# Patient Record
Sex: Male | Born: 1970 | Race: White | Hispanic: No | Marital: Married | Smoking: Never smoker
Health system: Southern US, Community
[De-identification: ages and names within clinical notes are randomized; demographics above are authoritative.]

## PROBLEM LIST (undated history)

## (undated) DIAGNOSIS — I10 Essential (primary) hypertension: Secondary | ICD-10-CM

## (undated) HISTORY — PX: BACK SURGERY: SHX140

---

## 1998-02-23 ENCOUNTER — Inpatient Hospital Stay (HOSPITAL_COMMUNITY): Admission: RE | Admit: 1998-02-23 | Discharge: 1998-02-25 | Payer: Self-pay | Admitting: Neurosurgery

## 2005-10-24 ENCOUNTER — Encounter: Payer: Self-pay | Admitting: Neurosurgery

## 2006-11-02 ENCOUNTER — Emergency Department: Payer: Self-pay | Admitting: Emergency Medicine

## 2014-05-11 DIAGNOSIS — I10 Essential (primary) hypertension: Secondary | ICD-10-CM | POA: Insufficient documentation

## 2019-07-24 ENCOUNTER — Other Ambulatory Visit: Payer: Self-pay | Admitting: Nurse Practitioner

## 2019-07-24 DIAGNOSIS — R109 Unspecified abdominal pain: Secondary | ICD-10-CM

## 2019-07-24 DIAGNOSIS — R3129 Other microscopic hematuria: Secondary | ICD-10-CM

## 2019-07-28 ENCOUNTER — Encounter (INDEPENDENT_AMBULATORY_CARE_PROVIDER_SITE_OTHER): Payer: Self-pay

## 2019-07-28 ENCOUNTER — Other Ambulatory Visit: Payer: Self-pay

## 2019-07-28 ENCOUNTER — Ambulatory Visit
Admission: RE | Admit: 2019-07-28 | Discharge: 2019-07-28 | Disposition: A | Discharge status: Home / Self Care | Payer: Managed Care, Other (non HMO) | Source: Ambulatory Visit | Attending: Nurse Practitioner | Admitting: Nurse Practitioner

## 2019-07-28 DIAGNOSIS — R3129 Other microscopic hematuria: Secondary | ICD-10-CM | POA: Insufficient documentation

## 2019-07-28 DIAGNOSIS — R109 Unspecified abdominal pain: Secondary | ICD-10-CM | POA: Diagnosis not present

## 2019-07-28 DIAGNOSIS — K760 Fatty (change of) liver, not elsewhere classified: Secondary | ICD-10-CM | POA: Insufficient documentation

## 2019-08-24 ENCOUNTER — Other Ambulatory Visit: Payer: Self-pay | Admitting: Gastroenterology

## 2019-08-24 DIAGNOSIS — R7401 Elevation of levels of liver transaminase levels: Secondary | ICD-10-CM

## 2019-08-31 ENCOUNTER — Ambulatory Visit
Admission: RE | Admit: 2019-08-31 | Discharge: 2019-08-31 | Disposition: A | Discharge status: Home / Self Care | Payer: Managed Care, Other (non HMO) | Source: Ambulatory Visit | Attending: Gastroenterology | Admitting: Gastroenterology

## 2019-08-31 ENCOUNTER — Other Ambulatory Visit: Payer: Self-pay

## 2019-08-31 DIAGNOSIS — R7401 Elevation of levels of liver transaminase levels: Secondary | ICD-10-CM | POA: Diagnosis present

## 2021-10-20 ENCOUNTER — Emergency Department: Payer: Managed Care, Other (non HMO)

## 2021-10-20 ENCOUNTER — Emergency Department
Admission: EM | Admit: 2021-10-20 | Discharge: 2021-10-20 | Disposition: A | Discharge status: Home / Self Care | Payer: Managed Care, Other (non HMO) | Attending: Emergency Medicine | Admitting: Emergency Medicine

## 2021-10-20 ENCOUNTER — Other Ambulatory Visit: Payer: Self-pay

## 2021-10-20 DIAGNOSIS — I10 Essential (primary) hypertension: Secondary | ICD-10-CM | POA: Diagnosis not present

## 2021-10-20 DIAGNOSIS — W06XXXA Fall from bed, initial encounter: Secondary | ICD-10-CM | POA: Insufficient documentation

## 2021-10-20 DIAGNOSIS — H81399 Other peripheral vertigo, unspecified ear: Secondary | ICD-10-CM | POA: Diagnosis not present

## 2021-10-20 DIAGNOSIS — R42 Dizziness and giddiness: Secondary | ICD-10-CM | POA: Diagnosis present

## 2021-10-20 HISTORY — DX: Essential (primary) hypertension: I10

## 2021-10-20 LAB — CBC
HCT: 49.4 % (ref 39.0–52.0)
Hemoglobin: 16.4 g/dL (ref 13.0–17.0)
MCH: 27.9 pg (ref 26.0–34.0)
MCHC: 33.2 g/dL (ref 30.0–36.0)
MCV: 84 fL (ref 80.0–100.0)
Platelets: 244 10*3/uL (ref 150–400)
RBC: 5.88 MIL/uL — ABNORMAL HIGH (ref 4.22–5.81)
RDW: 12.8 % (ref 11.5–15.5)
WBC: 8.7 10*3/uL (ref 4.0–10.5)
nRBC: 0 % (ref 0.0–0.2)

## 2021-10-20 LAB — BASIC METABOLIC PANEL
Anion gap: 12 (ref 5–15)
BUN: 15 mg/dL (ref 6–20)
CO2: 22 mmol/L (ref 22–32)
Calcium: 8.9 mg/dL (ref 8.9–10.3)
Chloride: 103 mmol/L (ref 98–111)
Creatinine, Ser: 1.35 mg/dL — ABNORMAL HIGH (ref 0.61–1.24)
GFR, Estimated: >60 mL/min (ref >60)
Glucose, Bld: 231 mg/dL — ABNORMAL HIGH (ref 70–99)
Potassium: 3.2 mmol/L — ABNORMAL LOW (ref 3.5–5.1)
Sodium: 137 mmol/L (ref 135–145)

## 2021-10-20 MED ORDER — DIAZEPAM 5 MG PO TABS: 5.0000 mg | ORAL_TABLET | Freq: Two times a day (BID) | ORAL | 0 refills | Status: AC | PRN

## 2021-10-20 MED ORDER — ONDANSETRON 4 MG PO TBDP: 4.0000 mg | ORAL_TABLET | Freq: Three times a day (TID) | ORAL | 0 refills | Status: DC | PRN

## 2021-10-20 MED ORDER — MECLIZINE HCL 25 MG PO TABS
25.0000 mg | ORAL_TABLET | Freq: Once | ORAL | Status: AC
  Administered 2021-10-20: 25 mg via ORAL
  Filled 2021-10-20: qty 1

## 2021-10-20 MED ORDER — DIAZEPAM 5 MG PO TABS
5.0000 mg | ORAL_TABLET | Freq: Once | ORAL | Status: AC
  Administered 2021-10-20: 5 mg via ORAL
  Filled 2021-10-20: qty 1

## 2021-10-20 MED ORDER — POTASSIUM CHLORIDE CRYS ER 20 MEQ PO TBCR
40.0000 meq | EXTENDED_RELEASE_TABLET | Freq: Once | ORAL | Status: AC
  Administered 2021-10-20: 40 meq via ORAL
  Filled 2021-10-20: qty 2

## 2021-10-20 NOTE — ED Provider Notes (Signed)
? ?[facility] ?Provider Note ? ? ? Event Date/Time  ? First MD Initiated Contact with Patient 10/20/21 0901   ?  (approximate) ? ? ?History  ? ?Chief Complaint ?Dizziness ? ? ?HPI ? ?Francisco Forbes is a 51 y.o. male with past medical history of hypertension and vertigo who presents to the ED complaining of dizziness.  Patient reports that he woke up this morning around 8:00 with sensation of severe dizziness.  He states it felt like the room was spinning around him and that he was going to fall off the bed.  This has been associated with nausea but he has not vomited.  He denies any associated vision changes, speech changes, numbness, or weakness in his extremities.  He has dealt with vertigo in the past, but states he has never had symptoms this severe.  He went to bed feeling fine last night at around 10:30 PM.  He denies any recent fever, cough, chest pain, shortness of breath, abdominal pain, or vomiting. ?  ? ? ?Physical Exam  ? ?Triage Vital Signs: ?ED Triage Vitals  ?Enc Vitals Group  ?   BP 10/20/21 0814 (!) 153/84  ?   Pulse Rate 10/20/21 0814 82  ?   Resp 10/20/21 0814 19  ?   Temp 10/20/21 0814 97.8 ?F (36.6 ?C)  ?   Temp Source 10/20/21 0814 Oral  ?   SpO2 10/20/21 0814 98 %  ?   Weight 10/20/21 0816 211 lb (95.7 kg)  ?   Height 10/20/21 0816 5\' 8"  (1.727 m)  ?   Head Circumference --   ?   Peak Flow --   ?   Pain Score 10/20/21 0816 0  ?   Pain Loc --   ?   Pain Edu? --   ?   Excl. in GC? --   ? ? ?Most recent vital signs: ?Vitals:  ? 10/20/21 0814 10/20/21 1020  ?BP: (!) 153/84 (!) 150/81  ?Pulse: 82 73  ?Resp: 19 19  ?Temp: 97.8 ?F (36.6 ?C) 98.7 ?F (37.1 ?C)  ?SpO2: 98% 97%  ? ? ?Constitutional: Alert and oriented. ?Eyes: Conjunctivae are normal.  Pupils equal, round, and reactive to light bilaterally. ?Head: Atraumatic. ?Nose: No congestion/rhinnorhea. ?Mouth/Throat: Mucous membranes are moist.  ?Cardiovascular: Normal rate, regular rhythm. Grossly normal heart sounds.  2+  radial pulses bilaterally. ?Respiratory: Normal respiratory effort.  No retractions. Lungs CTAB. ?Gastrointestinal: Soft and nontender. No distention. ?Musculoskeletal: No lower extremity tenderness nor edema.  ?Neurologic:  Normal speech and language. No gross focal neurologic deficits are appreciated. ? ? ? ?ED Results / Procedures / Treatments  ? ?Labs ?(all labs ordered are listed, but only abnormal results are displayed) ?Labs Reviewed  ?BASIC METABOLIC PANEL - Abnormal; Notable for the following components:  ?    Result Value  ? Potassium 3.2 (*)   ? Glucose, Bld 231 (*)   ? Creatinine, Ser 1.35 (*)   ? All other components within normal limits  ?CBC - Abnormal; Notable for the following components:  ? RBC 5.88 (*)   ? All other components within normal limits  ?URINALYSIS, ROUTINE W REFLEX MICROSCOPIC  ?CBG MONITORING, ED  ? ? ? ?EKG ? ?ED ECG REPORT ?10/22/21, the attending physician, personally viewed and interpreted this ECG. ? ? Date: 10/20/2021 ? EKG Time: 8:21 ? Rate: 76 ? Rhythm: normal sinus rhythm ? Axis: Normal ? Intervals:nonspecific intraventricular conduction delay ? ST&T Change: None ? ?RADIOLOGY ?MRI  brain reviewed by me with no obvious area of infarction. ? ?PROCEDURES: ? ?Critical Care performed: No ? ?Procedures ? ? ?MEDICATIONS ORDERED IN ED: ?Medications  ?potassium chloride SA (KLOR-CON M) CR tablet 40 mEq (40 mEq Oral Given 10/20/21 0945)  ?meclizine (ANTIVERT) tablet 25 mg (25 mg Oral Given 10/20/21 0945)  ?diazepam (VALIUM) tablet 5 mg (5 mg Oral Given 10/20/21 1049)  ? ? ? ?IMPRESSION / MDM / ASSESSMENT AND PLAN / ED COURSE  ?I reviewed the triage vital signs and the nursing notes. ?             ?               ? ?51 y.o. male with past medical history of hypertension and vertigo who presents to the ED complaining of sudden onset severe dizziness and nausea, with feeling that the room is spinning around him, starting this morning. ? ?Differential diagnosis includes, but is not  limited to, peripheral vertigo, central vertigo, stroke, dehydration, electrolyte abnormality, arrhythmia. ? ?Patient nontoxic-appearing and in no acute distress, vital signs are unremarkable and EKG shows no evidence of arrhythmia or ischemia.  He has a nonfocal neurologic exam and with severe intermittent symptoms, low suspicion for stroke or other central etiology of vertigo.  Symptoms seem consistent with a peripheral vertigo and we will treat with IV fluids, Zofran, and meclizine.  BMP is remarkable for mild hypokalemia, CBC without anemia or leukocytosis.  Plan to reassess following symptomatic management. ? ?Patient without any improvement in symptoms following Zofran and meclizine.  Given ongoing symptoms, he was further assessed with MRI of brain.  This is negative for acute process and with no evidence of acute stroke, symptoms consistent with a peripheral vertigo.  He had improvement in symptoms with dose of Valium and is appropriate for discharge home with PCP and ENT follow-up.  He was counseled to return to the ED for new worsening symptoms, patient agrees with plan. ? ?  ? ? ?FINAL CLINICAL IMPRESSION(S) / ED DIAGNOSES  ? ?Final diagnoses:  ?Peripheral vertigo, unspecified laterality  ? ? ? ?Rx / DC Orders  ? ?ED Discharge Orders   ? ?      Ordered  ?  diazepam (VALIUM) 5 MG tablet  Every 12 hours PRN       ? 10/20/21 1136  ?  ondansetron (ZOFRAN-ODT) 4 MG disintegrating tablet  Every 8 hours PRN       ? 10/20/21 1136  ? ?  ?  ? ?  ? ? ? ?Note:  This document was prepared using Dragon voice recognition software and may include unintentional dictation errors. ?  ?Chesley Noon, MD ?10/20/21 1139 ? ?

## 2021-10-20 NOTE — ED Triage Notes (Signed)
Patient to ER via POV with wife. Reports waking up this morning feelign dizzy, describes feeling like the world is spinning. States when he woke up he felt as though he was going to fall out of bed. History of vertigo. Reports going to bed last night at 2230 feeling normal. Denies headache. Also reports nausea and vomiting. Denies any other symptoms.  ?

## 2021-11-02 ENCOUNTER — Ambulatory Visit
Admission: EM | Admit: 2021-11-02 | Discharge: 2021-11-02 | Disposition: A | Discharge status: Home / Self Care | Payer: Managed Care, Other (non HMO) | Attending: Emergency Medicine | Admitting: Emergency Medicine

## 2021-11-02 DIAGNOSIS — I10 Essential (primary) hypertension: Secondary | ICD-10-CM | POA: Insufficient documentation

## 2021-11-02 DIAGNOSIS — R7302 Impaired glucose tolerance (oral): Secondary | ICD-10-CM | POA: Insufficient documentation

## 2021-11-02 DIAGNOSIS — R42 Dizziness and giddiness: Secondary | ICD-10-CM | POA: Insufficient documentation

## 2021-11-02 DIAGNOSIS — R202 Paresthesia of skin: Secondary | ICD-10-CM | POA: Insufficient documentation

## 2021-11-02 DIAGNOSIS — E669 Obesity, unspecified: Secondary | ICD-10-CM | POA: Insufficient documentation

## 2021-11-02 LAB — CBC WITH DIFFERENTIAL/PLATELET
Abs Immature Granulocytes: 0.03 10*3/uL (ref 0.00–0.07)
Basophils Absolute: 0.1 10*3/uL (ref 0.0–0.1)
Basophils Relative: 1 %
Eosinophils Absolute: 0.2 10*3/uL (ref 0.0–0.5)
Eosinophils Relative: 3 %
HCT: 49.3 % (ref 39.0–52.0)
Hemoglobin: 17 g/dL (ref 13.0–17.0)
Immature Granulocytes: 0 %
Lymphocytes Relative: 23 %
Lymphs Abs: 1.8 10*3/uL (ref 0.7–4.0)
MCH: 29 pg (ref 26.0–34.0)
MCHC: 34.5 g/dL (ref 30.0–36.0)
MCV: 84 fL (ref 80.0–100.0)
Monocytes Absolute: 0.6 10*3/uL (ref 0.1–1.0)
Monocytes Relative: 8 %
Neutro Abs: 5.1 10*3/uL (ref 1.7–7.7)
Neutrophils Relative %: 65 %
Platelets: 232 10*3/uL (ref 150–400)
RBC: 5.87 MIL/uL — ABNORMAL HIGH (ref 4.22–5.81)
RDW: 13 % (ref 11.5–15.5)
WBC: 7.8 10*3/uL (ref 4.0–10.5)
nRBC: 0 % (ref 0.0–0.2)

## 2021-11-02 LAB — HEMOGLOBIN A1C
Hgb A1c MFr Bld: 6.4 % — ABNORMAL HIGH (ref 4.8–5.6)
Mean Plasma Glucose: 136.98 mg/dL

## 2021-11-02 LAB — BASIC METABOLIC PANEL
Anion gap: 7 (ref 5–15)
BUN: 12 mg/dL (ref 6–20)
CO2: 28 mmol/L (ref 22–32)
Calcium: 9.2 mg/dL (ref 8.9–10.3)
Chloride: 101 mmol/L (ref 98–111)
Creatinine, Ser: 1.16 mg/dL (ref 0.61–1.24)
GFR, Estimated: >60 mL/min (ref >60)
Glucose, Bld: 221 mg/dL — ABNORMAL HIGH (ref 70–99)
Potassium: 4.1 mmol/L (ref 3.5–5.1)
Sodium: 136 mmol/L (ref 135–145)

## 2021-11-02 LAB — GLUCOSE, CAPILLARY: Glucose-Capillary: 229 mg/dL — ABNORMAL HIGH (ref 70–99)

## 2021-11-02 NOTE — ED Provider Notes (Signed)
?HPI ? ?SUBJECTIVE: ? ?Francisco Forbes is a 51 y.o. male who presents with feeling "uneasy 3 and jittery this morning.  He reports very mild bilateral arm and leg tingling that is intermittent, lasting seconds.  No nausea, vomiting, diaphoresis, perioral numbness or tingling, headache, visual changes, arm or leg weakness, facial droop, slurred speech, chest pain, palpitations, shortness of breath chest pressure or heaviness, abdominal pain, syncope, seizures, lower extremity edema, anuria, hematuria.  No recent decongestions, illicit drug use.  He states that he has 112 ounce Dr. Reino Kent a day.  Denies other caffeine intake.  He is eating and drinking well.  No recent diarrhea.  He is taking atenolol and has tried eating.  The symptoms resolved on their own.  No aggravating factors. ? ?Patient was seen in the ED on 4/28 with dizziness/vertigo, thought to have peripheral vertigo.  Glucose was 231, creatinine was 1.35, he  had mild hypokalemia which was replaced.  He was given Zofran, Valium, meclizine with improvement in his symptoms.  He had an MRI which was negative for a stroke.  He was advised to follow-up with PCP and ENT.  Sent home with Valium and Zofran.  He has had no further episodes of vertigo since the ED visit, and has his final ENT follow-up in 3 days.  He has a past medical history of hypertension for which he takes atenolol.  He does not measure his blood pressure at home, he does not know what it has been running recently.  He also has a past medical history of prediabetes.  No history of coronary disease, MI, stroke, chronic kidney disease, hypercholesterolemia.  PCP: [facility]. ? ?Past Medical History:  ?Diagnosis Date  ? Hypertension   ? ? ?Past Surgical History:  ?Procedure Laterality Date  ? BACK SURGERY    ? ? ?History reviewed. No pertinent family history. ? ?Social History  ? ?Tobacco Use  ? Smoking status: Never  ? Smokeless tobacco: Never  ?Vaping Use  ? Vaping Use: Never used   ?Substance Use Topics  ? Alcohol use: Never  ? Drug use: Never  ? ? ?No current facility-administered medications for this encounter. ? ?Current Outpatient Medications:  ?  atenolol (TENORMIN) 25 MG tablet, Take 1 tablet by mouth daily., Disp: , Rfl:  ?  fluticasone (FLONASE) 50 MCG/ACT nasal spray, Place into the nose., Disp: , Rfl:  ?  diazepam (VALIUM) 5 MG tablet, Take 1 tablet (5 mg total) by mouth every 12 (twelve) hours as needed (Dizziness)., Disp: 12 tablet, Rfl: 0 ?  metroNIDAZOLE (METROGEL) 1 % gel, Apply topically daily., Disp: , Rfl:  ?  ondansetron (ZOFRAN-ODT) 4 MG disintegrating tablet, Take 1 tablet (4 mg total) by mouth every 8 (eight) hours as needed for nausea or vomiting., Disp: 12 tablet, Rfl: 0 ?  scopolamine (TRANSDERM-SCOP) 1 MG/3DAYS, 1 patch every 3 (three) days., Disp: , Rfl:  ? ?Allergies  ?Allergen Reactions  ? Penicillin V Potassium   ?  Other reaction(s): Dizziness  ? ? ? ?ROS ? ?As noted in HPI.  ? ?Physical Exam ? ?BP (!) 162/89 (BP Location: Left Arm)   Pulse 76   Temp 98.2 ?F (36.8 ?C) (Oral)   Resp 18   Ht 5\' 8"  (1.727 m)   Wt 95.7 kg   SpO2 98%   BMI 32.08 kg/m?  ? ?Constitutional: Well developed, well nourished, no acute distress ?Eyes: PERRL, EOMI, conjunctiva normal bilaterally ?HENT: Normocephalic, atraumatic,mucus membranes moist ?Respiratory: Clear to auscultation bilaterally,  no rales, no wheezing, no rhonchi ?Cardiovascular: Normal rate and rhythm, no murmurs, no gallops, no rubs.  RP 2+.  PT 2+. ?GI: Soft, nondistended, normal bowel sounds, nontender, no rebound, no guarding ?skin: No rash, skin intact ?Musculoskeletal: No edema, no tenderness, no deformities ?Neurologic: Alert & oriented x 3, CN III-XII  intact, strength upper and lower extremities 5/5 and equal bilaterally, sensation upper and lower extremities grossly intact.  Finger-nose, heel shin normal.  Gait steady.  Negative Romberg. ?Psychiatric: Speech and behavior appropriate ? ? ?ED  Course ? ? ?Medications - No data to display ? ?Orders Placed This Encounter  ?Procedures  ? Glucose, capillary  ?  Standing Status:   Standing  ?  Number of Occurrences:   1  ? Basic metabolic panel  ?  Standing Status:   Standing  ?  Number of Occurrences:   1  ? CBC with Differential  ?  Standing Status:   Standing  ?  Number of Occurrences:   1  ? Hemoglobin A1c  ?  Standing Status:   Standing  ?  Number of Occurrences:   1  ? POC CBG, ED  ?  Standing Status:   Standing  ?  Number of Occurrences:   1  ? ED EKG  ?  See RFV. Complaint  ?  Standing Status:   Standing  ?  Number of Occurrences:   1  ?  Order Specific Question:   Reason for Exam  ?  Answer:   Other (See Comments)  ?  Order Specific Question:   Release to patient  ?  Answer:   Immediate  ? ED EKG  ?  Dizziness  ?  Standing Status:   Standing  ?  Number of Occurrences:   1  ?  Order Specific Question:   Reason for Exam  ?  Answer:   Other (See Comments)  ? EKG 12-Lead  ?  Standing Status:   Standing  ?  Number of Occurrences:   1  ? EKG 12-Lead  ?  Standing Status:   Standing  ?  Number of Occurrences:   1  ? ?Results for orders placed or performed during the hospital encounter of 11/02/21 (from the past 24 hour(s))  ?Glucose, capillary     Status: Abnormal  ? Collection Time: 11/02/21  9:48 AM  ?Result Value Ref Range  ? Glucose-Capillary 229 (H) 70 - 99 mg/dL  ?Basic metabolic panel     Status: Abnormal  ? Collection Time: 11/02/21 10:20 AM  ?Result Value Ref Range  ? Sodium 136 135 - 145 mmol/L  ? Potassium 4.1 3.5 - 5.1 mmol/L  ? Chloride 101 98 - 111 mmol/L  ? CO2 28 22 - 32 mmol/L  ? Glucose, Bld 221 (H) 70 - 99 mg/dL  ? BUN 12 6 - 20 mg/dL  ? Creatinine, Ser 1.16 0.61 - 1.24 mg/dL  ? Calcium 9.2 8.9 - 10.3 mg/dL  ? GFR, Estimated >60 >60 mL/min  ? Anion gap 7 5 - 15  ?CBC with Differential     Status: Abnormal  ? Collection Time: 11/02/21 10:20 AM  ?Result Value Ref Range  ? WBC 7.8 4.0 - 10.5 K/uL  ? RBC 5.87 (H) 4.22 - 5.81 MIL/uL  ?  Hemoglobin 17.0 13.0 - 17.0 g/dL  ? HCT 49.3 39.0 - 52.0 %  ? MCV 84.0 80.0 - 100.0 fL  ? MCH 29.0 26.0 - 34.0 pg  ? MCHC 34.5 30.0 -  36.0 g/dL  ? RDW 13.0 11.5 - 15.5 %  ? Platelets 232 150 - 400 K/uL  ? nRBC 0.0 0.0 - 0.2 %  ? Neutrophils Relative % 65 %  ? Neutro Abs 5.1 1.7 - 7.7 K/uL  ? Lymphocytes Relative 23 %  ? Lymphs Abs 1.8 0.7 - 4.0 K/uL  ? Monocytes Relative 8 %  ? Monocytes Absolute 0.6 0.1 - 1.0 K/uL  ? Eosinophils Relative 3 %  ? Eosinophils Absolute 0.2 0.0 - 0.5 K/uL  ? Basophils Relative 1 %  ? Basophils Absolute 0.1 0.0 - 0.1 K/uL  ? Immature Granulocytes 0 %  ? Abs Immature Granulocytes 0.03 0.00 - 0.07 K/uL  ? ?No results found. ? ?ED Clinical Impression ? ?1. Hypertension, unspecified type   ?2. Dizziness   ?3. Paresthesias   ?  ? ?ED Assessment/Plan ? ?ER records reviewed.  As noted in HPI. ? ?EKG: Normal sinus rhythm, rate 79.  Normal axis, incomplete right bundle branch block.  No ST-T wave changes.  No changes compared to EKG from 10/20/2021.  ? ?Doubt stroke as it does not make anatomic sense, and his complete neurologic exam is benign.  I do not think that we need to image his head today.  He had a recent negative MRI 2 weeks ago.  His EKG today is unremarkable.  He had renal insufficiency and hypokalemia with his last set of labs in the ED, so will repeat a BMP, CBC and do a hemoglobin A1c as a courtesy to his primary care provider. ? ?BMP unremarkable except for glucose of 221.  His creatinine has normalized.  CBC normal. ? ?Unsure as to the etiology of patient's symptoms, but there does not appear to be an emergency at this time.  He denies dizziness or vertigo today.  He has had no further episodes of vertigo since his ER visit 2 weeks ago.  He has a known normal complete neurologic exam, reassuring EKG, lab work.  Hemoglobin A1c pending.  He will follow-up this result with his PCP.  Will let PCP initiate diabetic treatment if necessary. ? ?Blood pressure noted.  Patient has no signs  or symptoms of a hypertensive emergency. ?Discussed labs, EKG, MDM, treatment plan, and plan for follow-up with patient Discussed sn/sx that should prompt return to the ED. patient agrees with plan.  ? ?No orders of the defined types were

## 2021-11-02 NOTE — ED Triage Notes (Signed)
Patient is here for "jitters, dizziness at times, tingling over extremities". Concerned with Blood Sugar. Last 2 wks "a pretty good case of Vertigo". "Woke up this am with feeling uneasy, jitters and dizziness continues". No chest pain. No sob.No recent cold/illness. "More arms tingling than numbness".  ?

## 2021-11-02 NOTE — Discharge Instructions (Addendum)
Your lab work is normal except for an elevated glucose.  I am sending off an hemoglobin A1c to make a diagnosis of diabetes.  You could still be a prediabetic.  I will let your primary care provider start diabetic medication based on this result if necessary.  Your EKG and your physical exam are reassuring.  Keep an eye on your blood pressure.  It is elevated today.  However, I do not think that you are having a stroke.  ? ?Decrease your salt intake. diet and exercise will lower your blood pressure significantly. It is important to keep your blood pressure under good control, as having a elevated blood pressure for prolonged periods of time significantly increases your risk of stroke, heart attacks, kidney damage, eye damage, and other problems. Measure your blood pressure once a day, preferably at the same time every day. Keep a log of this and bring it to your next doctor's appointment.  Bring your blood pressure cuff as well.   Return immediately to the ER if you start having chest pain, headache, problems seeing, problems talking, problems walking, if you feel like you're about to pass out, if you do pass out, if you have a seizure, or for any other concerns. ? ?Go to www.goodrx.com  or www.costplusdrugs.com to look up your medications. This will give you a list of where you can find your prescriptions at the most affordable prices. Or ask the pharmacist what the cash price is, or if they have any other discount programs available to help make your medication more affordable. This can be less expensive than what you would pay with insurance.   ?

## 2022-03-02 ENCOUNTER — Encounter
Admission: RE | Disposition: A | Discharge status: Home / Self Care | Payer: Self-pay | Source: Ambulatory Visit | Attending: Unknown Physician Specialty

## 2022-03-02 SURGERY — SEPTOPLASTY, NOSE
Anesthesia: General

## 2022-03-26 DIAGNOSIS — R7303 Prediabetes: Secondary | ICD-10-CM | POA: Insufficient documentation

## 2022-03-26 HISTORY — DX: Prediabetes: R73.03

## 2022-05-02 NOTE — Discharge Instructions (Signed)
[facility] ENDOSCOPIC SINUS SURGERY [facility]  What is Functional Endoscopic Sinus Surgery?  The Surgery involves making the natural openings of the sinuses larger by removing the bony partitions that separate the sinuses from the nasal cavity.  The natural sinus lining is preserved as much as possible to allow the sinuses to resume normal function after the surgery.  In some patients nasal polyps (excessively swollen lining of the sinuses) may be removed to relieve obstruction of the sinus openings.  The surgery is performed through the nose using lighted scopes, which eliminates the need for incisions on the face.  A septoplasty is a different procedure which is sometimes performed with sinus surgery.  It involves straightening the boy partition that separates the two sides of your nose.  A crooked or deviated septum may need repair if is obstructing the sinuses or nasal airflow.  Turbinate reduction is also often performed during sinus surgery.  The turbinates are bony proturberances from the side walls of the nose which swell and can obstruct the nose in patients with sinus and allergy problems.  Their size can be surgically reduced to help relieve nasal obstruction.  What Can Sinus Surgery Do For Me?  Sinus surgery can reduce the frequency of sinus infections requiring antibiotic treatment.  This can provide improvement in nasal congestion, post-nasal drainage, facial pressure and nasal obstruction.  Surgery will NOT prevent you from ever having an infection again, so it usually only for patients who get infections 4 or more times yearly requiring antibiotics, or for infections that do not clear with antibiotics.  It will not cure nasal allergies, so patients with allergies may still require medication to treat their allergies after surgery. Surgery may improve headaches related to sinusitis, however, some people will continue to  require medication to control sinus headaches related to allergies.  Surgery will do nothing for other forms of headache (migraine, tension or cluster).  What Are the Risks of Endoscopic Sinus Surgery?  Current techniques allow surgery to be performed safely with little risk, however, there are rare complications that patients should be aware of.  Because the sinuses are located around the eyes, there is risk of eye injury, including blindness, though again, this would be quite rare. This is usually a result of bleeding behind the eye during surgery, which can effect vision, though there are treatments to protect the vision and prevent permanent injury. More serious complications would include bleeding inside the brain cavity or damage to the brain.This happens when the fluid around the brain leaks out into the sinus cavity.  Again, all of these complications are uncommon, and spinal fluid leaks can be safely managed surgically if they occur.  The most common complication of sinus surgery is bleeding from the nose, which may require packing or cauterization of the nose.  Patients with polyps may experience recurrence of the polyps that would require revision surgery.  Alterations of sense of smell or injury to the tear ducts are also rare complications.   What is the Surgery Like, and what is the Recovery?  The Surgery usually takes a couple of hours to perform, and is usually performed under a general anesthetic (completely asleep).  Patients are usually discharged home after a couple of hours.  Sometimes during surgery it is necessary to pack the nose to control bleeding, and the packing is left in place for 24 - 48 hours, and removed by your surgeon.  If   a septoplasty was performed during the procedure, there is often a splint placed which must be removed after 5-7 days.   Discomfort: Pain is usually mild to moderate, and can be controlled by prescription pain medication or acetaminophen (Tylenol).   Aspirin, Ibuprofen (Advil, Motrin), or Naprosyn (Aleve) should be avoided, as they can cause increased bleeding.  Most patients feel sinus pressure like they have a bad head cold for several days.  Sleeping with your head elevated can help reduce swelling and facial pressure, as can ice packs over the face.  A humidifier may be helpful to keep the mucous and blood from drying in the nose.   Diet: There are no specific diet restrictions, however, you should generally start with clear liquids and a light diet of bland foods because the anesthetic can cause some nausea.  Advance your diet depending on how your stomach feels.  Taking your pain medication with food will often help reduce stomach upset which pain medications can cause.  Nasal Saline Irrigation: It is important to remove blood clots and dried mucous from the nose as it is healing.  This is done by having you irrigate the nose at least 3 - 4 times daily with a salt water solution.  We recommend using NeilMed Sinus Rinse (available at the drug store).  Fill the squeeze bottle with the solution, bend over a sink, and insert the tip of the squeeze bottle into the nose  of an inch.  Point the tip of the squeeze bottle towards the inside corner of the eye on the same side your irrigating.  Squeeze the bottle and gently irrigate the nose.  If you bend forward as you do this, most of the fluid will flow back out of the nose, instead of down your throat.   The solution should be warm, near body temperature, when you irrigate.   Each time you irrigate, you should use a full squeeze bottle.   Note that if you are instructed to use Nasal Steroid Sprays at any time after your surgery, irrigate with saline BEFORE using the steroid spray, so you do not wash it all out of the nose. Another product, Nasal Saline Gel (such as AYR Nasal Saline Gel) can be applied in each nostril 3 - 4 times daily to moisture the nose and reduce scabbing or crusting.  Bleeding:   Bloody drainage from the nose can be expected for several days, and patients are instructed to irrigate their nose frequently with salt water to help remove mucous and blood clots.  The drainage may be dark red or brown, though some fresh blood may be seen intermittently, especially after irrigation.  Do not blow you nose, as bleeding may occur. If you must sneeze, keep your mouth open to allow air to escape through your mouth.  If heavy bleeding occurs: Irrigate the nose with saline to rinse out clots, then spray the nose 3 - 4 times with Afrin Nasal Decongestant Spray.  The spray will constrict the blood vessels to slow bleeding.  Pinch the lower half of your nose shut to apply pressure, and lay down with your head elevated.  Ice packs over the nose may help as well. If bleeding persists despite these measures, you should notify your doctor.  Do not use the Afrin routinely to control nasal congestion after surgery, as it can result in worsening congestion and may affect healing.     Activity: Return to work varies among patients. Most patients will be out   of work at least 5 - 7 days to recover.  Patient may return to work after they are off of narcotic pain medication, and feeling well enough to perform the functions of their job.  Patients must avoid heavy lifting (over 10 pounds) or strenuous physical for 2 weeks after surgery, so your employer may need to assign you to light duty, or keep you out of work longer if light duty is not possible.  NOTE: you should not drive, operate dangerous machinery, do any mentally demanding tasks or make any important legal or financial decisions while on narcotic pain medication and recovering from the general anesthetic.    Call Your Doctor Immediately if You Have Any of the Following: Bleeding that you cannot control with the above measures Loss of vision, double vision, bulging of the eye or black eyes. Fever over 101 degrees Neck stiffness with severe headache,  fever, nausea and change in mental state. You are always encouraged to call anytime with concerns, however, please call with requests for pain medication refills during office hours.  Office Endoscopy: During follow-up visits your doctor will remove any packing or splints that may have been placed and evaluate and clean your sinuses endoscopically.  Topical anesthetic will be used to make this as comfortable as possible, though you may want to take your pain medication prior to the visit.  How often this will need to be done varies from patient to patient.  After complete recovery from the surgery, you may need follow-up endoscopy from time to time, particularly if there is concern of recurrent infection or nasal polyps.  

## 2022-05-04 ENCOUNTER — Encounter: Payer: Self-pay | Admitting: Unknown Physician Specialty

## 2022-05-11 ENCOUNTER — Encounter
Admission: RE | Disposition: A | Discharge status: Home / Self Care | Payer: Self-pay | Source: Ambulatory Visit | Attending: Unknown Physician Specialty

## 2022-05-11 ENCOUNTER — Other Ambulatory Visit: Payer: Self-pay

## 2022-05-11 ENCOUNTER — Ambulatory Visit: Payer: Managed Care, Other (non HMO) | Admitting: Anesthesiology

## 2022-05-11 ENCOUNTER — Ambulatory Visit
Admission: RE | Admit: 2022-05-11 | Discharge: 2022-05-11 | Disposition: A | Discharge status: Home / Self Care | Payer: Managed Care, Other (non HMO) | Source: Ambulatory Visit | Attending: Unknown Physician Specialty | Admitting: Unknown Physician Specialty

## 2022-05-11 ENCOUNTER — Encounter: Payer: Self-pay | Admitting: Unknown Physician Specialty

## 2022-05-11 DIAGNOSIS — J343 Hypertrophy of nasal turbinates: Secondary | ICD-10-CM | POA: Diagnosis not present

## 2022-05-11 DIAGNOSIS — I10 Essential (primary) hypertension: Secondary | ICD-10-CM | POA: Diagnosis not present

## 2022-05-11 DIAGNOSIS — J342 Deviated nasal septum: Secondary | ICD-10-CM | POA: Diagnosis not present

## 2022-05-11 DIAGNOSIS — J3489 Other specified disorders of nose and nasal sinuses: Secondary | ICD-10-CM | POA: Diagnosis present

## 2022-05-11 DIAGNOSIS — Z6831 Body mass index (BMI) 31.0-31.9, adult: Secondary | ICD-10-CM | POA: Insufficient documentation

## 2022-05-11 HISTORY — PX: SEPTOPLASTY: SHX2393

## 2022-05-11 HISTORY — PX: NASAL TURBINATE REDUCTION: SHX2072

## 2022-05-11 SURGERY — SEPTOPLASTY, NOSE
Anesthesia: General | Site: Nose

## 2022-05-11 MED ORDER — EPHEDRINE SULFATE-NACL 50-0.9 MG/10ML-% IV SOSY
PREFILLED_SYRINGE | INTRAVENOUS | Status: DC | PRN
  Administered 2022-05-11 (×3): 5 mg via INTRAVENOUS

## 2022-05-11 MED ORDER — TRIPLE ANTIBIOTIC 3.5-400-5000 EX OINT
TOPICAL_OINTMENT | CUTANEOUS | Status: DC | PRN
  Administered 2022-05-11: 2 via TOPICAL

## 2022-05-11 MED ORDER — PHENYLEPHRINE HCL 0.5 % NA SOLN
NASAL | Status: DC | PRN
  Administered 2022-05-11: 15 mL via TOPICAL

## 2022-05-11 MED ORDER — ONDANSETRON HCL 4 MG/2ML IJ SOLN: 4.0000 mg | Freq: Once | INTRAMUSCULAR | Status: DC | PRN

## 2022-05-11 MED ORDER — LACTATED RINGERS IV SOLN: INTRAVENOUS | Status: DC

## 2022-05-11 MED ORDER — ACETAMINOPHEN 10 MG/ML IV SOLN: 1000.0000 mg | Freq: Once | INTRAVENOUS | Status: DC | PRN

## 2022-05-11 MED ORDER — MIDAZOLAM HCL 5 MG/5ML IJ SOLN
INTRAMUSCULAR | Status: DC | PRN
  Administered 2022-05-11: 2 mg via INTRAVENOUS

## 2022-05-11 MED ORDER — SUCCINYLCHOLINE CHLORIDE 200 MG/10ML IV SOSY
PREFILLED_SYRINGE | INTRAVENOUS | Status: DC | PRN
  Administered 2022-05-11: 100 mg via INTRAVENOUS

## 2022-05-11 MED ORDER — OXYMETAZOLINE HCL 0.05 % NA SOLN
6.0000 | Freq: Once | NASAL | Status: AC
  Administered 2022-05-11: 6 via NASAL

## 2022-05-11 MED ORDER — SULFAMETHOXAZOLE-TRIMETHOPRIM 800-160 MG PO TABS: 1.0000 | ORAL_TABLET | Freq: Two times a day (BID) | ORAL | 0 refills | Status: DC

## 2022-05-11 MED ORDER — OXYCODONE HCL 5 MG PO TABS: 5.0000 mg | ORAL_TABLET | Freq: Once | ORAL | Status: AC | PRN

## 2022-05-11 MED ORDER — ONDANSETRON HCL 4 MG/2ML IJ SOLN
INTRAMUSCULAR | Status: DC | PRN
  Administered 2022-05-11: 4 mg via INTRAVENOUS

## 2022-05-11 MED ORDER — DEXAMETHASONE SODIUM PHOSPHATE 4 MG/ML IJ SOLN
INTRAMUSCULAR | Status: DC | PRN
  Administered 2022-05-11: 8 mg via INTRAVENOUS

## 2022-05-11 MED ORDER — OXYCODONE HCL 5 MG/5ML PO SOLN
5.0000 mg | Freq: Once | ORAL | Status: AC | PRN
  Administered 2022-05-11: 5 mg via ORAL

## 2022-05-11 MED ORDER — PROPOFOL 10 MG/ML IV BOLUS
INTRAVENOUS | Status: DC | PRN
  Administered 2022-05-11: 200 mg via INTRAVENOUS

## 2022-05-11 MED ORDER — ACETAMINOPHEN 10 MG/ML IV SOLN
INTRAVENOUS | Status: DC | PRN
  Administered 2022-05-11: 1000 mg via INTRAVENOUS

## 2022-05-11 MED ORDER — HYDROCODONE-ACETAMINOPHEN 5-300 MG PO TABS: 1.0000 | ORAL_TABLET | ORAL | 0 refills | Status: DC | PRN

## 2022-05-11 MED ORDER — FENTANYL CITRATE (PF) 100 MCG/2ML IJ SOLN
INTRAMUSCULAR | Status: DC | PRN
  Administered 2022-05-11 (×2): 50 ug via INTRAVENOUS

## 2022-05-11 MED ORDER — LIDOCAINE-EPINEPHRINE 1 %-1:100000 IJ SOLN
INTRAMUSCULAR | Status: DC | PRN
  Administered 2022-05-11: 12 mL

## 2022-05-11 MED ORDER — FENTANYL CITRATE PF 50 MCG/ML IJ SOSY: 25.0000 ug | PREFILLED_SYRINGE | INTRAMUSCULAR | Status: DC | PRN

## 2022-05-11 SURGICAL SUPPLY — 23 items
DRAPE HEAD BAR (DRAPES) ×1 IMPLANT
DRESSING NASL FOAM PST OP SINU (MISCELLANEOUS) IMPLANT
DRSG NASAL FOAM POST OP SINU (MISCELLANEOUS) ×2
ELECT REM PT RETURN 9FT ADLT (ELECTROSURGICAL) ×1
ELECTRODE REM PT RTRN 9FT ADLT (ELECTROSURGICAL) ×1 IMPLANT
GLOVE SURG ENC TEXT LTX SZ7.5 (GLOVE) ×2 IMPLANT
HANDLE YANKAUER SUCT BULB TIP (MISCELLANEOUS) ×1 IMPLANT
KIT TURNOVER KIT A (KITS) ×1 IMPLANT
NDL HYPO 25GX1X1/2 BEV (NEEDLE) ×1 IMPLANT
NEEDLE HYPO 25GX1X1/2 BEV (NEEDLE) ×1 IMPLANT
PACK ENT CUSTOM (PACKS) ×1 IMPLANT
SPLINT NASAL SEPTAL BLV .25 LG (MISCELLANEOUS) IMPLANT
SPONGE NEURO XRAY DETECT 1X3 (DISPOSABLE) ×1 IMPLANT
SPONGE SURGIFOAM ABS GEL 12-7 (HEMOSTASIS) IMPLANT
STRAP BODY AND KNEE 60X3 (MISCELLANEOUS) ×1 IMPLANT
SUCTION COAG ELEC 10 HAND CTRL (ELECTROSURGICAL) IMPLANT
SUT CHROMIC 3-0 (SUTURE) ×1
SUT CHROMIC 3-0 KS 27XMFL CR (SUTURE) ×1
SUT ETHILON 3-0 KS 30 BLK (SUTURE) ×1 IMPLANT
SUTURE CHRMC 3-0 KS 27XMFL CR (SUTURE) ×1 IMPLANT
SYR 10ML LL (SYRINGE) ×1 IMPLANT
TOWEL OR 17X26 4PK STRL BLUE (TOWEL DISPOSABLE) ×1 IMPLANT
WATER STERILE IRR 250ML POUR (IV SOLUTION) ×1 IMPLANT

## 2022-05-11 NOTE — Anesthesia Postprocedure Evaluation (Signed)
Anesthesia Post Note  Patient: Francisco Forbes  Procedure(s) Performed: SEPTOPLASTY (Nose) TURBINATE REDUCTION/SUBMUCOSAL RESECTION (Nose)  Patient location during evaluation: PACU Anesthesia Type: General Level of consciousness: awake and alert, oriented and patient cooperative Pain management: pain level controlled Vital Signs Assessment: post-procedure vital signs reviewed and stable Respiratory status: spontaneous breathing, nonlabored ventilation and respiratory function stable Cardiovascular status: blood pressure returned to baseline and stable Postop Assessment: adequate PO intake Anesthetic complications: no   No notable events documented.   Last Vitals:  Vitals:   05/11/22 1200 05/11/22 1212  BP: 130/85 123/73  Pulse: 66 71  Resp: 13 15  Temp:  (!) 36.3 C  SpO2: 98% 94%    Last Pain:  Vitals:   05/11/22 1212  TempSrc:   PainSc: 4                  Reed Breech

## 2022-05-11 NOTE — Transfer of Care (Signed)
Immediate Anesthesia Transfer of Care Note  Patient: Francisco Forbes  Procedure(s) Performed: SEPTOPLASTY (Nose) TURBINATE REDUCTION/SUBMUCOSAL RESECTION (Nose)  Patient Location: PACU  Anesthesia Type: General  Level of Consciousness: awake, alert  and patient cooperative  Airway and Oxygen Therapy: Patient Spontanous Breathing and Patient connected to supplemental oxygen  Post-op Assessment: Post-op Vital signs reviewed, Patient's Cardiovascular Status Stable, Respiratory Function Stable, Patent Airway and No signs of Nausea or vomiting  Post-op Vital Signs: Reviewed and stable  Complications: No notable events documented.

## 2022-05-11 NOTE — Anesthesia Procedure Notes (Signed)
Procedure Name: Intubation Date/Time: 05/11/2022 10:35 AM  Performed by: Londell Moh, CRNAPre-anesthesia Checklist: Patient identified, Emergency Drugs available, Suction available, Patient being monitored and Timeout performed Patient Re-evaluated:Patient Re-evaluated prior to induction Oxygen Delivery Method: Circle system utilized Preoxygenation: Pre-oxygenation with 100% oxygen Induction Type: IV induction Ventilation: Mask ventilation without difficulty Laryngoscope Size: Mac and 4 Grade View: Grade I Tube type: Oral Rae Tube size: 7.5 mm Number of attempts: 1 Placement Confirmation: ETT inserted through vocal cords under direct vision, positive ETCO2 and breath sounds checked- equal and bilateral Tube secured with: Tape Dental Injury: Teeth and Oropharynx as per pre-operative assessment

## 2022-05-11 NOTE — H&P (Signed)
The patient's history has been reviewed, patient examined, no change in status, stable for surgery.  Questions were answered to the patients satisfaction.  

## 2022-05-11 NOTE — Anesthesia Preprocedure Evaluation (Addendum)
Anesthesia Evaluation  Patient identified by MRN, date of birth, ID band Patient awake    Reviewed: Allergy & Precautions, NPO status , Patient's Chart, lab work & pertinent test results  History of Anesthesia Complications Negative for: history of anesthetic complications  Airway Mallampati: II   Neck ROM: Full    Dental  (+) Missing   Pulmonary neg pulmonary ROS   Pulmonary exam normal breath sounds clear to auscultation       Cardiovascular hypertension, Normal cardiovascular exam Rhythm:Regular Rate:Normal  ECG 11/02/21:  Normal sinus rhythm Incomplete right bundle branch block   Neuro/Psych negative neurological ROS     GI/Hepatic negative GI ROS,,,  Endo/Other  Obesity   Renal/GU negative Renal ROS     Musculoskeletal   Abdominal   Peds  Hematology negative hematology ROS (+)   Anesthesia Other Findings   Reproductive/Obstetrics                             Anesthesia Physical Anesthesia Plan  ASA: 2  Anesthesia Plan: General   Post-op Pain Management:    Induction: Intravenous  PONV Risk Score and Plan: 2 and Ondansetron, Dexamethasone and Treatment may vary due to age or medical condition  Airway Management Planned: Oral ETT  Additional Equipment:   Intra-op Plan:   Post-operative Plan: Extubation in OR  Informed Consent: I have reviewed the patients History and Physical, chart, labs and discussed the procedure including the risks, benefits and alternatives for the proposed anesthesia with the patient or authorized representative who has indicated his/her understanding and acceptance.     Dental advisory given  Plan Discussed with: CRNA  Anesthesia Plan Comments: (Patient consented for risks of anesthesia including but not limited to:  - adverse reactions to medications - damage to eyes, teeth, lips or other oral mucosa - nerve damage due to positioning  -  sore throat or hoarseness - damage to heart, brain, nerves, lungs, other parts of body or loss of life  Informed patient about role of CRNA in peri- and intra-operative care.  Patient voiced understanding.)       Anesthesia Quick Evaluation

## 2022-05-11 NOTE — Op Note (Signed)
PREOPERATIVE DIAGNOSIS:  Chronic nasal obstruction.  POSTOPERATIVE DIAGNOSIS:  Chronic nasal obstruction.  SURGEON:  Davina Poke, M.D.  NAME OF PROCEDURE:  Nasal septoplasty. Submucous resection of inferior turbinates.  OPERATIVE FINDINGS:  Severe nasal septal deformity, hypertrophy of the inferior turbinates.   DESCRIPTION OF THE PROCEDURE:  Francisco Forbes was identified in the holding area and taken to the operating room and placed in the supine position.  After general endotracheal anesthesia was induced, the table was turned 45 degrees and the patient was placed in a semi-Fowler position.  The nose was then topically anesthetized with Lidocaine, cotton pledgets were placed within each nostril. After approximately 5 minutes, this was removed at which time a local anesthetic of 1% Lidocaine 1:100,000 units of Epinephrine was used to inject the inferior turbinates in the nasal septum. A total of 12 ml was used. Examination of the nose showed a severe right nasal septal deformity and tremendous hypertrophied inferior turbinate.  Beginning on the right hand side a hemitransfixion incision was then created on the leading edge of the septum on the right.  A subperichondrial plane was elevated posteriorly on the left and taken back to the perpendicular plate of the ethmoid where subperiosteal plane was elevated posteriorly on the left. A large septal spur was identified on the right hand side impacting on the inferior turbinate.  An inferior rim of cartilage was removed anteriorly with care taken to leave an anterior strut to prevent nasal collapse. With this strut removed the perpendicular plate of the ethmoid was separated from the quadrangular cartilage. The large septal spur was removed.  The septum was then replaced in the midline. Reinspection through each nostril showed excellent reduction of the septal deformity. A left posterior inferior fenestration was then created to allow hematoma  drainage.  With the septoplasty completed, beginning on the left-hand side, a 15 blade was used to incise along the inferior edge of the inferior turbinate. A superior laterally based flap was then elevated. The underlying conchal bone of mucosa was excised using Knight scissors. The flap was then laid back over the turbinate stump and cauterized using suction cautery. In a similar fashion the submucous resection was performed on the right.  With the submucous resection completed bilaterally and no active bleeding, the hemitransfixion incision was then closed using two interrupted 3-0 chromic sutures.  Plastic nasal septal splints were placed within each nostril and affixed to the septum using a 3-0 nylon suture. Stammberger was then used beneath each inferior turbinate for hemostasis.    The patient tolerated the procedure well, was returned to anesthesia, extubated in the operating room, and taken to the recovery room in stable condition.    CULTURES:  None.  SPECIMENS:  None.  ESTIMATED BLOOD LOSS:  25 cc.  Davina Poke  05/11/2022  11:29 AM

## 2022-05-14 ENCOUNTER — Encounter: Payer: Self-pay | Admitting: Unknown Physician Specialty

## 2022-05-16 ENCOUNTER — Telehealth: Payer: Self-pay | Admitting: Otolaryngology

## 2022-05-16 NOTE — Telephone Encounter (Signed)
Received message from answering service regarding patient. He states that he has been having significant facial pressure since surgery. Had stents removed yesterday but continues to have issues. He denies bleeding, vision problems, severe headache. I explained that the pressure is expected postoperatively. He can continue with OTC pain meds and irrigations. He will reach out if symptoms worsen.

## 2022-07-26 IMAGING — MR MR HEAD W/O CM
11 series · 48 of 48 positions shown · non-contrast
Comparison: None.

CLINICAL DATA: Neuro deficit, acute, stroke suspected

EXAM:
MRI HEAD WITHOUT CONTRAST
TECHNIQUE: Multiplanar, multiecho pulse sequences of the brain and surrounding
structures were obtained without intravenous contrast.

[Series 5: ax dwi_tracew · axial · 3.0mm · 1.80mm/px · z∈[-111,+40]mm · 5 of 48 slices shown]
[im 1/48]
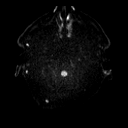
[im 12/48]
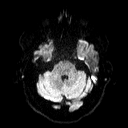
[im 24/48]
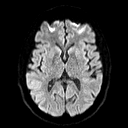
[im 36/48]
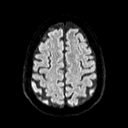
[im 48/48]
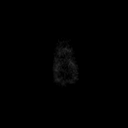

[Series 6: ax dwi_adc · axial · 3.0mm · 1.80mm/px · z∈[-111,+40]mm · 4 of 48 slices shown]
[im 1/48]
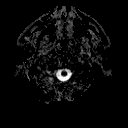
[im 16/48]
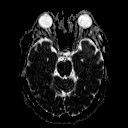
[im 32/48]
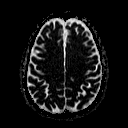
[im 48/48]
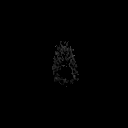

[Series 7: cor dwi_tracew · coronal · 5.0mm · 1.80mm/px · 3 of 42 slices shown]
[im 1/42]
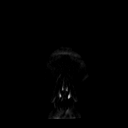
[im 21/42]
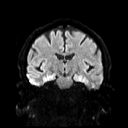
[im 42/42]
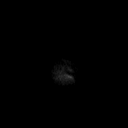

[Series 8: cor dwi_adc · coronal · 5.0mm · 1.80mm/px · 3 of 42 slices shown]
[im 1/42]
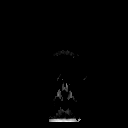
[im 21/42]
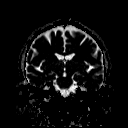
[im 42/42]
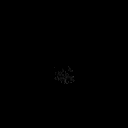

[Series 9: T1 · sagittal · 5.0mm · 0.62mm/px · 2 of 23 slices shown (1 of 2)]
[im 1/23]
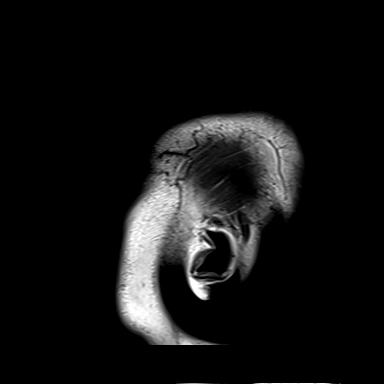
[im 23/23]
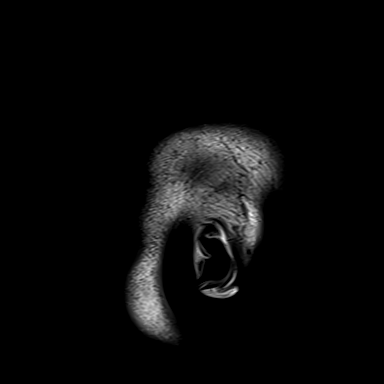

[Series 10: T2 · axial · 5.0mm · 0.86mm/px · z∈[-109,+43]mm · 2 of 27 slices shown (1 of 2)]
[im 1/27]
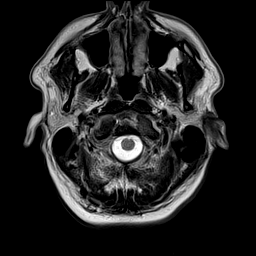
[im 27/27]
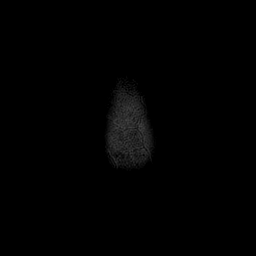

[Series 12: pha_images · axial · 3.0mm · 0.90mm/px · z∈[-108,+41]mm · 4 of 52 slices shown]
[im 1/52]
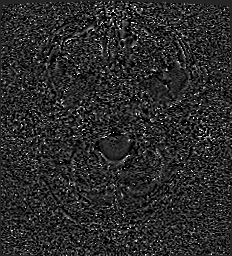
[im 18/52]
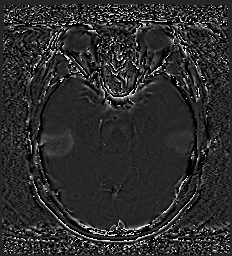
[im 35/52]
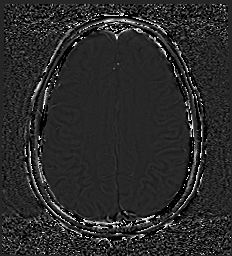
[im 52/52]
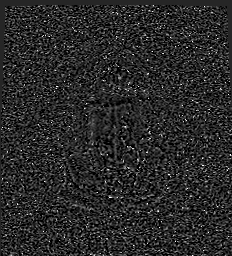

[Series 13: swi_images · axial · 3.0mm · 0.90mm/px · z∈[-108,+41]mm · 4 of 52 slices shown]
[im 1/52]
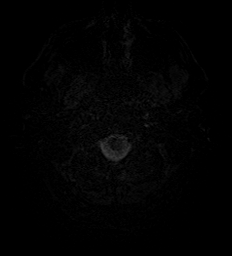
[im 18/52]
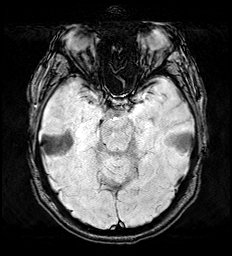
[im 35/52]
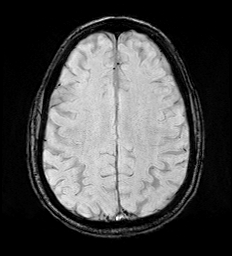
[im 52/52]
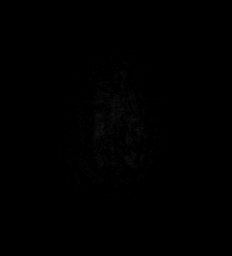

[Series 15: FLAIR · axial · 3.0mm · 0.69mm/px · z∈[-112,+46]mm · 4 of 55 slices shown]
[im 1/55]
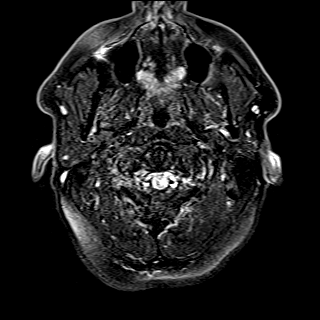
[im 19/55]
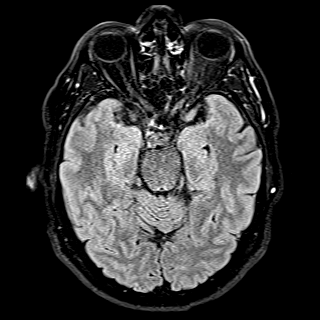
[im 37/55]
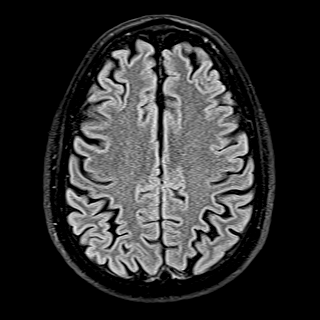
[im 55/55]
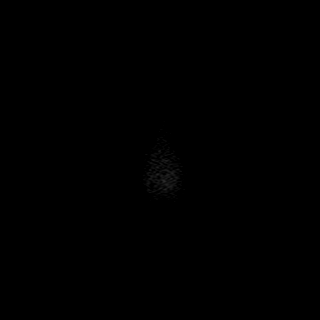

[Series 16: T1 · axial · 1.0mm · 0.98mm/px · z∈[-126,+44]mm · 14 of 175 slices shown (2 of 2)]
[im 1/175]
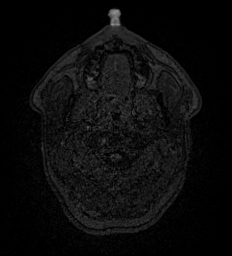
[im 14/175]
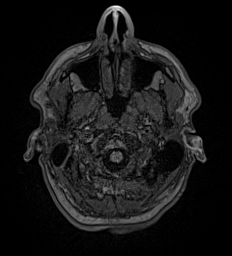
[im 27/175]
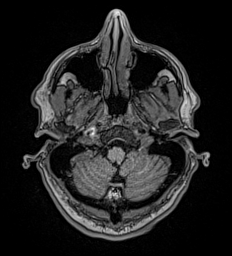
[im 41/175]
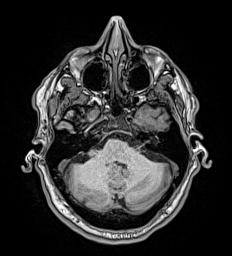
[im 54/175]
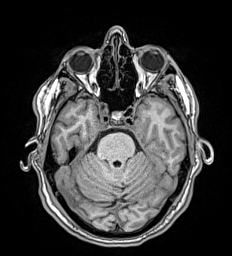
[im 67/175]
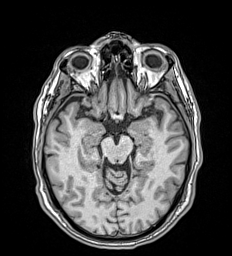
[im 81/175]
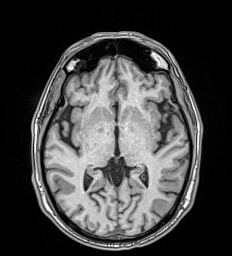
[im 94/175]
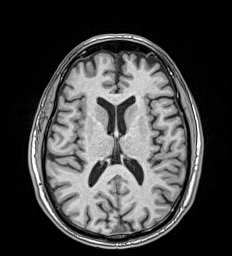
[im 108/175]
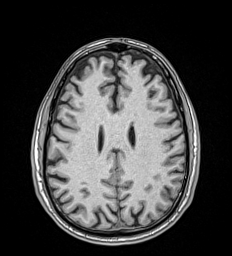
[im 121/175]
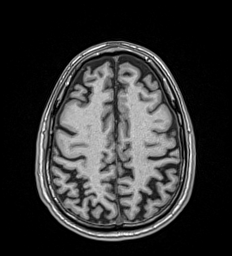
[im 134/175]
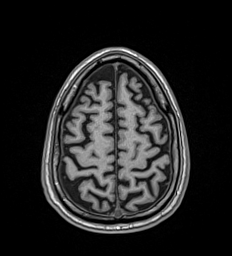
[im 148/175]
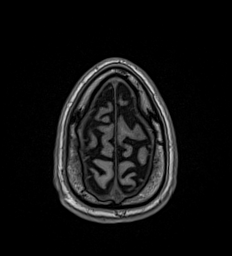
[im 161/175]
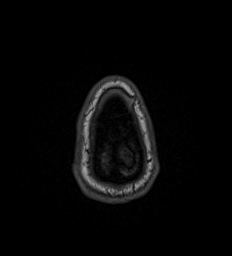
[im 175/175]
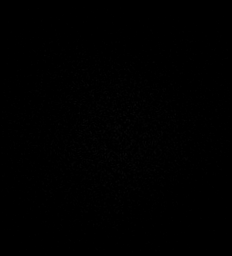

[Series 17: T2 · coronal · 5.0mm · 0.86mm/px · 3 of 31 slices shown (2 of 2)]
[im 1/31]
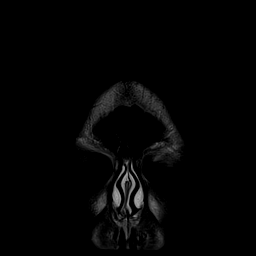
[im 16/31]
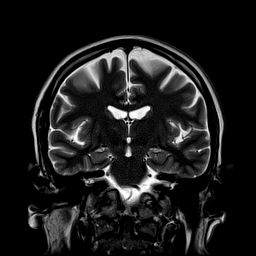
[im 31/31]
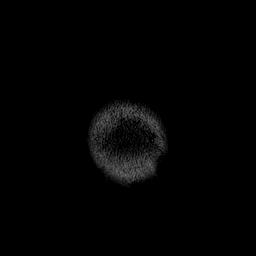

[48 of 48 positions shown; findings below may reference images not displayed]

FINDINGS: Brain: No acute infarction, hemorrhage, hydrocephalus, extra-axial
collection or mass lesion.

Vascular: Major arterial flow voids are maintained skull base.

Skull and upper cervical spine: Normal marrow signal.

Sinuses/Orbits: Clear sinuses.  No acute orbital findings.

Other: No mastoid effusions.
IMPRESSION: No evidence of acute intracranial abnormality.

## 2022-11-14 ENCOUNTER — Encounter: Payer: Self-pay | Admitting: Emergency Medicine

## 2022-11-14 ENCOUNTER — Ambulatory Visit
Admission: EM | Admit: 2022-11-14 | Discharge: 2022-11-14 | Disposition: A | Discharge status: Home / Self Care | Payer: BC Managed Care – PPO | Attending: Family Medicine | Admitting: Family Medicine

## 2022-11-14 DIAGNOSIS — U071 COVID-19: Secondary | ICD-10-CM | POA: Insufficient documentation

## 2022-11-14 LAB — SARS CORONAVIRUS 2 BY RT PCR: SARS Coronavirus 2 by RT PCR: POSITIVE — AB

## 2022-11-14 LAB — GROUP A STREP BY PCR: Group A Strep by PCR: NOT DETECTED

## 2022-11-14 MED ORDER — LIDOCAINE VISCOUS HCL 2 % MT SOLN: 15.0000 mL | OROMUCOSAL | 0 refills | Status: DC | PRN

## 2022-11-14 NOTE — ED Provider Notes (Signed)
[facility]    CSN: [id number] Arrival date & time: 11/14/22  1540      History   Chief Complaint Chief Complaint  Patient presents with   Sore Throat   Headache   Cough   Fever   Covid Positive    HPI BUDD LUCKE is a 52 y.o. male.   HPI  History History obtained from the patient and his wife . Avary presents for sore thorat, headache, cough and fever. Tmax 100.7 F.  He has been taking OTC medications. His home COVID test was positive. He played softball recently and is unsure if any of the people he was around were sick. His throat pain is the most worrisome symptoms as it is keeping him up at night.     Past Medical History:  Diagnosis Date   Hypertension     Patient Active Problem List   Diagnosis Date Noted   Obesity (BMI 30-39.9) 11/02/2021   IGT (impaired glucose tolerance) 11/02/2021   Hepatic steatosis 07/28/2019   Hypertension 05/11/2014    Past Surgical History:  Procedure Laterality Date   BACK SURGERY     NASAL TURBINATE REDUCTION N/A 05/11/2022   Procedure: TURBINATE REDUCTION/SUBMUCOSAL RESECTION;  Surgeon: Linus Salmons, MD;  Location: [facility];  Service: ENT;  Laterality: N/A;   SEPTOPLASTY N/A 05/11/2022   Procedure: SEPTOPLASTY;  Surgeon: Linus Salmons, MD;  Location: [facility];  Service: ENT;  Laterality: N/A;       Home Medications    Prior to Admission medications   Medication Sig Start Date End Date Taking? Authorizing Provider  atenolol (TENORMIN) 25 MG tablet Take 1 tablet by mouth daily. 07/06/21  Yes [provider]  lidocaine (XYLOCAINE) 2 % solution Use as directed 15 mLs in the mouth or throat as needed for mouth pain. 11/14/22  Yes Anzlee Hinesley, DO  HYDROcodone-Acetaminophen 5-300 MG TABS Take 1-2 tablets by mouth every 4 (four) hours as needed. 05/11/22   Linus Salmons, MD  metroNIDAZOLE (METROGEL) 1 % gel Apply topically daily. Patient not taking: Reported on  05/04/2022 10/05/21   [provider]  ondansetron (ZOFRAN-ODT) 4 MG disintegrating tablet Take 1 tablet (4 mg total) by mouth every 8 (eight) hours as needed for nausea or vomiting. Patient not taking: Reported on 05/04/2022 10/20/21   Chesley Noon, MD  scopolamine (TRANSDERM-SCOP) 1 MG/3DAYS 1 patch every 3 (three) days. Patient not taking: Reported on 05/04/2022 10/26/21   [provider]  sulfamethoxazole-trimethoprim (BACTRIM DS) 800-160 MG tablet Take 1 tablet by mouth 2 (two) times daily. 05/11/22   Linus Salmons, MD    Family History History reviewed. No pertinent family history.  Social History Social History   Tobacco Use   Smoking status: Never   Smokeless tobacco: Never  Vaping Use   Vaping Use: Never used  Substance Use Topics   Alcohol use: Never   Drug use: Never     Allergies   Penicillin v potassium   Review of Systems Review of Systems: negative unless otherwise stated in HPI.      Physical Exam Triage Vital Signs ED Triage Vitals  Enc Vitals Group     BP 11/14/22 1609 138/84     Pulse Rate 11/14/22 1609 74     Resp 11/14/22 1609 18     Temp 11/14/22 1609 100.2 F (37.9 C)     Temp Source 11/14/22 1609 Oral     SpO2 11/14/22 1609 98 %     Weight --  Height --      Head Circumference --      Peak Flow --      Pain Score 11/14/22 1608 8     Pain Loc --      Pain Edu? --      Excl. in GC? --    No data found.  Updated Vital Signs BP 138/84 (BP Location: Left Arm)   Pulse 74   Temp 100.2 F (37.9 C) (Oral)   Resp 18   SpO2 98%   Visual Acuity Right Eye Distance:   Left Eye Distance:   Bilateral Distance:    Right Eye Near:   Left Eye Near:    Bilateral Near:     Physical Exam GEN:     alert, non-toxic appearing male in no distress    HENT:  mucus membranes moist, oropharyngeal without lesions, mild erythema, no tonsillar hypertrophy or exudates,  moderate erythematous edematous turbinates, clear nasal  discharge,bilateral TM normal EYES:   pupils equal and reactive, no scleral injection or discharge NECK:  normal ROM, +lymphadenopathy, no meningismus   RESP:  no increased work of breathing, clear to auscultation bilaterally CVS:   regular rate and rhythm Skin:   warm and dry, no rash on visible skin    UC Treatments / Results  Labs (all labs ordered are listed, but only abnormal results are displayed) Labs Reviewed  SARS CORONAVIRUS 2 BY RT PCR - Abnormal; Notable for the following components:      Result Value   SARS Coronavirus 2 by RT PCR POSITIVE (*)    All other components within normal limits  GROUP A STREP BY PCR    EKG   Radiology No results found.  Procedures Procedures (including critical care time)  Medications Ordered in UC Medications - No data to display  Initial Impression / Assessment and Plan / UC Course  I have reviewed the triage vital signs and the nursing notes.  Pertinent labs & imaging results that were available during my care of the patient were reviewed by me and considered in my medical decision making (see chart for details).       Pt is a 52 y.o. male who presents for 3 days of respiratory symptoms. Makaih has elevated 100.2 F. afebrile here without recent antipyretics. Satting well on room air. Overall pt is non-toxic appearing, well hydrated, without respiratory distress. Pulmonary exam is unremarkable.  Strep PCR is negative. COVID testing obtained and was positive. Discussed symptomatic treatment.  Explained lack of efficacy of antibiotics in viral disease.  Typical duration of symptoms discussed. Lidocaine gel for sore throat.   Return and ED precautions given and voiced understanding. Discussed MDM, treatment plan and plan for follow-up with patient who agrees with plan.     Final Clinical Impressions(s) / UC Diagnoses   Final diagnoses:  COVID-19     Discharge Instructions      Your test for COVID-19 was positive, meaning  that you were infected with the novel coronavirus and could give the germ to others.  The CDC recommendation suggest returning to normal activities when, for at least 24 hours, symptoms are improving overall, and if a fever (greater than 100.4 F) was present, it has been gone without use of a fever-reducing medication.  You should wear a mask for the next 5 days to prevent the spread of disease. Please continue good preventive care measures, including:  frequent hand-washing, avoid touching your face, cover coughs/sneezes, stay out of crowds and  keep a 6 foot distance from others.  Go to the nearest hospital emergency room if fever/cough/breathlessness are severe or illness seems like a threat to life.  If your were prescribed medication. Stop by the pharmacy to pick it up. You can take Tylenol and/or Ibuprofen as needed for fever reduction and pain relief.    For cough: honey 1/2 to 1 teaspoon (you can dilute the honey in water or another fluid).  You can also use guaifenesin and dextromethorphan for cough. You can use a humidifier for chest congestion and cough.  If you don't have a humidifier, you can sit in the bathroom with the hot shower running.      For sore throat: try warm salt water gargles, Mucinex sore throat cough drops or cepacol lozenges, throat spray, warm tea or water with lemon/honey, popsicles or ice, or OTC cold relief medicine for throat discomfort. You can also purchase chloraseptic spray at the pharmacy or dollar store.   For congestion: take a daily anti-histamine like Zyrtec, Claritin, and a oral decongestant, such as pseudoephedrine.  You can also use Flonase 1-2 sprays in each nostril daily. Afrin is also a good option, if you do not have high blood pressure.    It is important to stay hydrated: drink plenty of fluids (water, gatorade/powerade/pedialyte, juices, or teas) to keep your throat moisturized and help further relieve irritation/discomfort.    Return or go to the  Emergency Department if symptoms worsen or do not improve in the next few days      ED Prescriptions     Medication Sig Dispense Auth. Provider   lidocaine (XYLOCAINE) 2 % solution Use as directed 15 mLs in the mouth or throat as needed for mouth pain. 100 mL Katha Cabal, DO      PDMP not reviewed this encounter.   Katha Cabal, DO 11/14/22 1823

## 2022-11-14 NOTE — ED Triage Notes (Signed)
Pt presents with a cough, fever, headache and sore throat x 3 days. At home covid test positive today.

## 2022-11-14 NOTE — Discharge Instructions (Addendum)
Your test for COVID-19 was positive, meaning that you were infected with the novel coronavirus and could give the germ to others.  The CDC recommendation suggest returning to normal activities when, for at least 24 hours, symptoms are improving overall, and if a fever (greater than 100.4 F) was present, it has been gone without use of a fever-reducing medication.  You should wear a mask for the next 5 days to prevent the spread of disease. Please continue good preventive care measures, including:  frequent hand-washing, avoid touching your face, cover coughs/sneezes, stay out of crowds and keep a 6 foot distance from others.  Go to the nearest hospital emergency room if fever/cough/breathlessness are severe or illness seems like a threat to life.  If your were prescribed medication. Stop by the pharmacy to pick it up. You can take Tylenol and/or Ibuprofen as needed for fever reduction and pain relief.    For cough: honey 1/2 to 1 teaspoon (you can dilute the honey in water or another fluid).  You can also use guaifenesin and dextromethorphan for cough. You can use a humidifier for chest congestion and cough.  If you don't have a humidifier, you can sit in the bathroom with the hot shower running.      For sore throat: try warm salt water gargles, Mucinex sore throat cough drops or cepacol lozenges, throat spray, warm tea or water with lemon/honey, popsicles or ice, or OTC cold relief medicine for throat discomfort. You can also purchase chloraseptic spray at the pharmacy or dollar store.   For congestion: take a daily anti-histamine like Zyrtec, Claritin, and a oral decongestant, such as pseudoephedrine.  You can also use Flonase 1-2 sprays in each nostril daily. Afrin is also a good option, if you do not have high blood pressure.    It is important to stay hydrated: drink plenty of fluids (water, gatorade/powerade/pedialyte, juices, or teas) to keep your throat moisturized and help further relieve  irritation/discomfort.    Return or go to the Emergency Department if symptoms worsen or do not improve in the next few days

## 2023-04-08 ENCOUNTER — Ambulatory Visit (INDEPENDENT_AMBULATORY_CARE_PROVIDER_SITE_OTHER): Payer: BC Managed Care – PPO

## 2023-04-08 ENCOUNTER — Ambulatory Visit
Admission: RE | Admit: 2023-04-08 | Discharge: 2023-04-08 | Disposition: A | Discharge status: Home / Self Care | Payer: BC Managed Care – PPO | Source: Ambulatory Visit | Attending: Family Medicine | Admitting: Family Medicine

## 2023-04-08 VITALS — BP 155/90 | HR 56 | Temp 98.1°F | Resp 18

## 2023-04-08 DIAGNOSIS — K5904 Chronic idiopathic constipation: Secondary | ICD-10-CM | POA: Diagnosis not present

## 2023-04-08 DIAGNOSIS — R140 Abdominal distension (gaseous): Secondary | ICD-10-CM

## 2023-04-08 DIAGNOSIS — K5900 Constipation, unspecified: Secondary | ICD-10-CM | POA: Diagnosis not present

## 2023-04-08 MED ORDER — POLYETHYLENE GLYCOL 3350 17 GM/SCOOP PO POWD: ORAL | 5 refills | Status: DC

## 2023-04-08 NOTE — Discharge Instructions (Signed)
See handout on constipation relief.  Take Gas-Ex to help relieve the gas.

## 2023-04-08 NOTE — ED Triage Notes (Signed)
Patient states that he's been bloated for a week. He's having regular BM. His stomach just feels bloated and has a burning sensation across the middle .

## 2023-04-08 NOTE — ED Provider Notes (Signed)
[facility]    CSN: [id number] Arrival date & time: 04/08/23  1153      History   Chief Complaint Chief Complaint  Patient presents with   Abdominal Pain    HPI Francisco Forbes is a 52 y.o. male.   HPI  Francisco Forbes presents for intermittent abdominal bloating with generalized discomfot for the past week. He played softball this weekend without any difficulty.    Has a "tingling burn" across his abdomen. He poops typically once a day in the morning.  Yesterday, he was more bloated. Took laxative yesterday and GasEx last week without relief.   Has one bowel movement daily.  Doesn't strain to have a stool. No blood in stool or urine. Has history of kidney stones.      Symptoms Nausea/Vomiting: no  Diarrhea: no  Constipation: yes  Melena/BRBPR: no  Hematemesis: no  Anorexia: no  Fever/Chills: no  Dysuria: no  Rash: no  Wt loss: no   Sore throat: no   Cough: no Nasal congestion : no  Sleep disturbance: no Back Pain: no Headache: no   Past Medical History:  Diagnosis Date   Hypertension     Patient Active Problem List   Diagnosis Date Noted   Obesity (BMI 30-39.9) 11/02/2021   IGT (impaired glucose tolerance) 11/02/2021   Hepatic steatosis 07/28/2019   Hypertension 05/11/2014    Past Surgical History:  Procedure Laterality Date   BACK SURGERY     NASAL TURBINATE REDUCTION N/A 05/11/2022   Procedure: TURBINATE REDUCTION/SUBMUCOSAL RESECTION;  Surgeon: Linus Salmons, MD;  Location: [facility];  Service: ENT;  Laterality: N/A;   SEPTOPLASTY N/A 05/11/2022   Procedure: SEPTOPLASTY;  Surgeon: Linus Salmons, MD;  Location: [facility];  Service: ENT;  Laterality: N/A;       Home Medications    Prior to Admission medications   Medication Sig Start Date End Date Taking? Authorizing Provider  atenolol (TENORMIN) 25 MG tablet Take 1 tablet by mouth daily. 07/06/21  Yes [provider]  polyethylene glycol powder  (GLYCOLAX/MIRALAX) 17 GM/SCOOP powder Use 8 capfuls in 32 oz of liquid once and may repeat the next day if needed for clean out 04/08/23  Yes Romanita Fager, DO  HYDROcodone-Acetaminophen 5-300 MG TABS Take 1-2 tablets by mouth every 4 (four) hours as needed. 05/11/22   Linus Salmons, MD  lidocaine (XYLOCAINE) 2 % solution Use as directed 15 mLs in the mouth or throat as needed for mouth pain. 11/14/22   Katha Cabal, DO  metroNIDAZOLE (METROGEL) 1 % gel Apply topically daily. Patient not taking: Reported on 05/04/2022 10/05/21   [provider]  ondansetron (ZOFRAN-ODT) 4 MG disintegrating tablet Take 1 tablet (4 mg total) by mouth every 8 (eight) hours as needed for nausea or vomiting. Patient not taking: Reported on 05/04/2022 10/20/21   Chesley Noon, MD  scopolamine (TRANSDERM-SCOP) 1 MG/3DAYS 1 patch every 3 (three) days. Patient not taking: Reported on 05/04/2022 10/26/21   [provider]  sulfamethoxazole-trimethoprim (BACTRIM DS) 800-160 MG tablet Take 1 tablet by mouth 2 (two) times daily. 05/11/22   Linus Salmons, MD    Family History History reviewed. No pertinent family history.  Social History Social History   Tobacco Use   Smoking status: Never   Smokeless tobacco: Never  Vaping Use   Vaping status: Never Used  Substance Use Topics   Alcohol use: Never   Drug use: Never     Allergies   Penicillin v potassium  Review of Systems Review of Systems :negative unless otherwise stated in HPI.      Physical Exam Triage Vital Signs ED Triage Vitals  Encounter Vitals Group     BP 04/08/23 1224 (!) 155/90     Systolic BP Percentile --      Diastolic BP Percentile --      Pulse Rate 04/08/23 1224 (!) 56     Resp 04/08/23 1224 18     Temp 04/08/23 1224 98.1 F (36.7 C)     Temp Source 04/08/23 1224 Oral     SpO2 04/08/23 1224 97 %     Weight --      Height --      Head Circumference --      Peak Flow --      Pain Score 04/08/23 1223  0     Pain Loc --      Pain Education --      Exclude from Growth Chart --    No data found.  Updated Vital Signs BP (!) 155/90 (BP Location: Left Arm)   Pulse (!) 56   Temp 98.1 F (36.7 C) (Oral)   Resp 18   SpO2 97%   Visual Acuity Right Eye Distance:   Left Eye Distance:   Bilateral Distance:    Right Eye Near:   Left Eye Near:    Bilateral Near:     Physical Exam  GEN: pleasant well appearing male, in no acute distress  RESP: no increased work of breathing ABD: Bowel sounds present. Soft, non-tender, mildly distended.  No guarding, no rebound, no appreciable hepatosplenomegaly, no CVA tenderness, negative McBurney's, negative Murphy MSK: no extremity edema SKIN: warm, dry, no rash on visible skin  UC Treatments / Results  Labs (all labs ordered are listed, but only abnormal results are displayed) Labs Reviewed - No data to display  EKG  If EKG performed, see my interpretation and MDM section  Radiology DG Abd 1 View  Result Date: 04/08/2023 CLINICAL DATA:  Bloating for 1 week EXAM: ABDOMEN - 1 VIEW COMPARISON:  CT abdomen/pelvis 07/28/2019 FINDINGS: There is a nonobstructive bowel gas pattern. There is a moderate stool burden in the right colon. There are probable small bilateral renal stones measuring up to 4 mm on the right. There is no other abnormal soft tissue calcification There is no acute osseous abnormality. IMPRESSION: 1. Moderate colonic stool burden without evidence of obstruction. 2. Probable bilateral renal stones measuring up to 4 mm. Electronically Signed   By: Francisco Forbes M.D.   On: 04/08/2023 15:21     Procedures Procedures (including critical care time)  Medications Ordered in UC Medications - No data to display  Initial Impression / Assessment and Plan / UC Course  I have reviewed the triage vital signs and the nursing notes.  Pertinent labs & imaging results that were available during my care of the patient were reviewed by me and  considered in my medical decision making (see chart for details).       Patient is a  52 y.o. malewith history kidney stones HTN, hepatic steatosis  who presents after having insidious abdominal bloating for the past week.  Overall, patient is well-appearing, well-hydrated, and in no acute distress.  Vital signs stable.  Larryis afebrile.  Exam is  not concerning for an acute abdomen.  Obtained KUB which was personally reviewed by me and without acute obstruction but has appreciable stool burden with air in transverse colon.  Patient aware  the radiologist has not read his xray and is comfortable with the preliminary read by me. Will review radiologist read when available and call patient if a change in plan is warranted.  Pt agreeable to this plan prior to discharge.   Provided and reviewed constipation clean out and maintenance plan with patient using Miralax and Senna. Discussed eating small meals frequently and increased fluid intake, as well as gradual increase in fiber intake with dried fruits, vegetables with skins, beans, whole grains and cereal. Goal 35g of fiber per day. Encouraged drinking hot beverages and prune juice; add probiotic-containing foods like pasteurized yogurt and kefir; encourage increased physical activity.     Follow-up, return and ED precautions given. Discussed MDM, treatment plan and plan for follow-up with patient who agrees with plan.    Final Clinical Impressions(s) / UC Diagnoses   Final diagnoses:  Abdominal bloating  Chronic idiopathic constipation     Discharge Instructions      See handout on constipation relief.  Take Gas-Ex to help relieve the gas.       ED Prescriptions     Medication Sig Dispense Auth. Provider   polyethylene glycol powder (GLYCOLAX/MIRALAX) 17 GM/SCOOP powder Use 8 capfuls in 32 oz of liquid once and may repeat the next day if needed for clean out 500 g Katha Cabal, DO      PDMP not reviewed this encounter.    Katha Cabal, DO 04/08/23 1610

## 2023-06-26 ENCOUNTER — Ambulatory Visit: Payer: Self-pay

## 2023-07-11 NOTE — Progress Notes (Signed)
Celso Amy, PA-C [address]  [address]  [place], [place] [address]  Main: [phone]  Fax: [phone]   Gastroenterology Consultation  Referring Provider:     [facility] Primary Care Physician:  [facility] Primary Gastroenterologist:  Celso Amy, PA-C  Reason for Consultation:     Abdominal bloating, gas, constipation        HPI:   Francisco Forbes is a 53 y.o. y/o male referred for consultation & management  by [facility].    He went to [facility] urgent [facility] in [facility] 03/2023 to evaluate abdominal bloating with generalized discomfort for 1 week.  Mild discomfort.  Has bowel movement once daily.  Occasionally takes Dulcolax laxative OTC and Gas-X as needed.  Denies rectal bleeding, nausea, vomiting, or diarrhea.  Admits to constipation.    Abdominal x-ray 1 view 03/2023 showed moderate stool burden consistent with constipation.  No obstruction.  06/28/2023 lab: Hemoglobin 17.6.  WBC 8.5. 01/2022 normal LFTs.  History of hepatic steatosis, hypertension, obesity.  08/2019 RUQ ultrasound (for elevated LFTs): Cholelithiasis and mild hepatic steatosis.  6 mm gallbladder polyp.  Several gallstones largest 6 mm.  07/2019 CT abdomen pelvis without contrast: Bilateral kidney stones, hepatic steatosis, cholelithiasis.  03/2020 Colonoscopy Dr. Norma Fredrickson @ [facility]: Diverticulosis and no polyps.  10-year repeat.  Past Medical History:  Diagnosis Date   Hypertension    Prediabetes 03/26/2022    Past Surgical History:  Procedure Laterality Date   BACK SURGERY     NASAL TURBINATE REDUCTION N/A 05/11/2022   Procedure: TURBINATE REDUCTION/SUBMUCOSAL RESECTION;  Surgeon: Linus Salmons, MD;  Location: [facility];  Service: ENT;  Laterality: N/A;   SEPTOPLASTY N/A 05/11/2022   Procedure: SEPTOPLASTY;  Surgeon: Linus Salmons, MD;  Location: [facility];  Service: ENT;  Laterality: N/A;    Prior to Admission medications   Medication Sig Start Date End  Date Taking? Authorizing Provider  atenolol (TENORMIN) 25 MG tablet Take 1 tablet by mouth daily. 07/06/21  Yes [provider]     History reviewed. No pertinent family history.   Social History   Tobacco Use   Smoking status: Never   Smokeless tobacco: Never  Vaping Use   Vaping status: Never Used  Substance Use Topics   Alcohol use: Never   Drug use: Never    Allergies as of 07/12/2023 - Review Complete 07/12/2023  Allergen Reaction Noted   Penicillin v potassium  05/05/2014    Review of Systems:    All systems reviewed and negative except where noted in HPI.   Physical Exam:  BP 121/76   Pulse 64   Temp 98.2 F (36.8 C)   Ht 5\' 8"  (1.727 m)   Wt 210 lb (95.3 kg)   BMI 31.93 kg/m  No LMP for male patient.  General:   Alert,  Well-developed, well-nourished, pleasant and cooperative in NAD Lungs:  Respirations even and unlabored.  Clear throughout to auscultation.   No wheezes, crackles, or rhonchi. No acute distress. Heart:  Regular rate and rhythm; no murmurs, clicks, rubs, or gallops. Abdomen:  Normal bowel sounds.  No bruits.  Soft, and non-distended without masses, hepatosplenomegaly or hernias noted.  No Tenderness.  No guarding or rebound tenderness.    Neurologic:  Alert and oriented x3;  grossly normal neurologically. Psych:  Alert and cooperative. Normal mood and affect.  Imaging Studies: No results found.  Assessment and Plan:   Francisco Forbes is a 53 y.o. y/o male has  been referred for:  1.  Constipation - Mild  Lab: TSH  Start magnesium 400 mg 1 tablet once daily.  Start align probiotic 1 capsule once daily.  Drink 64 ounces of water and fluids daily.  Recommend high-fiber diet with fruits, vegetables, whole grains.  2.  Cholelithiasis - Asymptomatic.  Recommend low-fat diet.  Follow-up if he develops right upper quadrant pain.  Patient education discussed.  3.  Hepatic steatosis  Lab: CMP  Recommend a low-fat diet, regular  exercise, and weight loss. Patient education handout about fatty liver disease was given and discussed.  4.  Colon cancer screening -is up-to-date  Normal colonoscopy 03/2020 by Dr. Norma Fredrickson at [facility].  No family history of colon cancer.  10-year repeat screening colonoscopy will be due 03/2030.  Follow-up sooner if he develops anemia, rectal bleeding, or other alarm symptoms.   Follow up as needed if he has recurrent or worsening GI symptoms.  Celso Amy, PA-C

## 2023-07-12 ENCOUNTER — Encounter: Payer: Self-pay | Admitting: Physician Assistant

## 2023-07-12 ENCOUNTER — Ambulatory Visit: Payer: BC Managed Care – PPO | Admitting: Physician Assistant

## 2023-07-12 VITALS — BP 121/76 | HR 64 | Temp 98.2°F | Ht 68.0 in | Wt 210.0 lb

## 2023-07-12 DIAGNOSIS — K8020 Calculus of gallbladder without cholecystitis without obstruction: Secondary | ICD-10-CM | POA: Diagnosis not present

## 2023-07-12 DIAGNOSIS — K760 Fatty (change of) liver, not elsewhere classified: Secondary | ICD-10-CM

## 2023-07-12 DIAGNOSIS — R140 Abdominal distension (gaseous): Secondary | ICD-10-CM

## 2023-07-12 DIAGNOSIS — K5900 Constipation, unspecified: Secondary | ICD-10-CM | POA: Diagnosis not present

## 2023-07-12 NOTE — Patient Instructions (Addendum)
Start OTC Magnesium 400mg  1 tablet daily.  Continue Probiotic Daily.  Drink 64 ounces of water and fluids daily.  Eat high-fiber diet with fresh fruits, vegetables, whole grains.

## 2023-07-13 LAB — COMPREHENSIVE METABOLIC PANEL
ALT: 27 [IU]/L (ref 0–44)
AST: 36 [IU]/L (ref 0–40)
Albumin: 4.5 g/dL (ref 3.8–4.9)
Alkaline Phosphatase: 63 [IU]/L (ref 44–121)
BUN/Creatinine Ratio: 12 (ref 9–20)
BUN: 15 mg/dL (ref 6–24)
Bilirubin Total: 0.7 mg/dL (ref 0.0–1.2)
CO2: 24 mmol/L (ref 20–29)
Calcium: 9.8 mg/dL (ref 8.7–10.2)
Chloride: 102 mmol/L (ref 96–106)
Creatinine, Ser: 1.23 mg/dL (ref 0.76–1.27)
Globulin, Total: 2.8 g/dL (ref 1.5–4.5)
Glucose: 122 mg/dL — ABNORMAL HIGH (ref 70–99)
Potassium: 4.4 mmol/L (ref 3.5–5.2)
Sodium: 140 mmol/L (ref 134–144)
Total Protein: 7.3 g/dL (ref 6.0–8.5)
eGFR: 70 mL/min/{1.73_m2} (ref >59)

## 2023-07-13 LAB — TSH: TSH: 1.52 u[IU]/mL (ref 0.450–4.500)

## 2023-07-15 ENCOUNTER — Encounter: Payer: Self-pay | Admitting: Physician Assistant

## 2023-08-29 ENCOUNTER — Ambulatory Visit
Admission: RE | Admit: 2023-08-29 | Discharge: 2023-08-29 | Disposition: A | Discharge status: Home / Self Care | Source: Ambulatory Visit | Attending: Physician Assistant | Admitting: Physician Assistant

## 2023-08-29 VITALS — BP 147/81 | HR 71 | Temp 98.5°F | Resp 16 | Ht 68.0 in | Wt 210.0 lb

## 2023-08-29 DIAGNOSIS — R0981 Nasal congestion: Secondary | ICD-10-CM

## 2023-08-29 DIAGNOSIS — R051 Acute cough: Secondary | ICD-10-CM | POA: Diagnosis not present

## 2023-08-29 DIAGNOSIS — J111 Influenza due to unidentified influenza virus with other respiratory manifestations: Secondary | ICD-10-CM | POA: Diagnosis not present

## 2023-08-29 MED ORDER — PROMETHAZINE-DM 6.25-15 MG/5ML PO SYRP: 5.0000 mL | ORAL_SOLUTION | Freq: Every evening | ORAL | 0 refills | Status: AC | PRN

## 2023-08-29 NOTE — ED Provider Notes (Signed)
 [facility]    CSN: [id number] Arrival date & time: 08/29/23  1430      History   Chief Complaint Chief Complaint  Patient presents with   Cough    Appt   Fever   Nasal Congestion    HPI Francisco Forbes is a 53 y.o. male presenting for 4-day history of illness.  Patient reports fever up to 101.5 degrees, fatigue, cough, congestion and bodyaches.  States he has not really had a fever today.  He has been taking Tylenol with the last dose couple of hours ago.  Had a TeleDoc visit a couple days ago and was told he probably had bronchitis.  He was given Jerilynn Som and has been elevating the head of his bed which has helped somewhat.  He feels a little bit better today.  His wife is starting to get sick.  They recently returned from a cruise.  He denies any chest pain or shortness of breath.  No other concerns.  HPI  Past Medical History:  Diagnosis Date   Hypertension    Prediabetes 03/26/2022    Patient Active Problem List   Diagnosis Date Noted   Obesity (BMI 30-39.9) 11/02/2021   IGT (impaired glucose tolerance) 11/02/2021   Hepatic steatosis 07/28/2019   Hypertension 05/11/2014    Past Surgical History:  Procedure Laterality Date   BACK SURGERY     NASAL TURBINATE REDUCTION N/A 05/11/2022   Procedure: TURBINATE REDUCTION/SUBMUCOSAL RESECTION;  Surgeon: Linus Salmons, MD;  Location: [facility];  Service: ENT;  Laterality: N/A;   SEPTOPLASTY N/A 05/11/2022   Procedure: SEPTOPLASTY;  Surgeon: Linus Salmons, MD;  Location: [facility];  Service: ENT;  Laterality: N/A;       Home Medications    Prior to Admission medications   Medication Sig Start Date End Date Taking? Authorizing Provider  atenolol (TENORMIN) 25 MG tablet Take 1 tablet by mouth daily. 07/06/21  Yes [provider]  promethazine-dextromethorphan (PROMETHAZINE-DM) 6.25-15 MG/5ML syrup Take 5 mLs by mouth at bedtime as needed for cough. 08/29/23  Yes Shirlee Latch PA-C    Family History History reviewed. No pertinent family history.  Social History Social History   Tobacco Use   Smoking status: Never   Smokeless tobacco: Never  Vaping Use   Vaping status: Never Used  Substance Use Topics   Alcohol use: Never   Drug use: Never     Allergies   Penicillin v potassium   Review of Systems Review of Systems  Constitutional:  Positive for fatigue and fever.  HENT:  Positive for congestion and rhinorrhea. Negative for sinus pressure, sinus pain and sore throat.   Respiratory:  Positive for cough. Negative for shortness of breath.   Cardiovascular:  Negative for chest pain.  Gastrointestinal:  Negative for abdominal pain, diarrhea, nausea and vomiting.  Musculoskeletal:  Positive for myalgias.  Neurological:  Negative for weakness, light-headedness and headaches.  Hematological:  Negative for adenopathy.     Physical Exam Triage Vital Signs ED Triage Vitals [08/29/23 1446]  Encounter Vitals Group     BP      Systolic BP Percentile      Diastolic BP Percentile      Pulse      Resp 16     Temp      Temp Source Oral     SpO2      Weight      Height      Head Circumference  Peak Flow      Pain Score      Pain Loc      Pain Education      Exclude from Growth Chart    No data found.  Updated Vital Signs BP (!) 147/81 (BP Location: Right Arm)   Pulse 71   Temp 98.5 F (36.9 C) (Oral)   Resp 16   Ht 5\' 8"  (1.727 m)   Wt 210 lb (95.3 kg)   SpO2 97%   BMI 31.93 kg/m      Physical Exam Vitals and nursing note reviewed.  Constitutional:      General: He is not in acute distress.    Appearance: Normal appearance. He is well-developed. He is not ill-appearing.  HENT:     Head: Normocephalic and atraumatic.     Nose: Congestion present.     Mouth/Throat:     Mouth: Mucous membranes are moist.     Pharynx: Oropharynx is clear.  Eyes:     General: No scleral icterus.    Conjunctiva/sclera: Conjunctivae  normal.  Cardiovascular:     Rate and Rhythm: Normal rate and regular rhythm.  Pulmonary:     Effort: Pulmonary effort is normal. No respiratory distress.     Breath sounds: Normal breath sounds. No wheezing, rhonchi or rales.  Musculoskeletal:     Cervical back: Neck supple.  Skin:    General: Skin is warm and dry.     Capillary Refill: Capillary refill takes less than 2 seconds.  Neurological:     General: No focal deficit present.     Mental Status: He is alert. Mental status is at baseline.     Motor: No weakness.     Gait: Gait normal.  Psychiatric:        Mood and Affect: Mood normal.        Behavior: Behavior normal.      UC Treatments / Results  Labs (all labs ordered are listed, but only abnormal results are displayed) Labs Reviewed - No data to display  EKG   Radiology No results found.  Procedures Procedures (including critical care time)  Medications Ordered in UC Medications - No data to display  Initial Impression / Assessment and Plan / UC Course  I have reviewed the triage vital signs and the nursing notes.  Pertinent labs & imaging results that were available during my care of the patient were reviewed by me and considered in my medical decision making (see chart for details).   53 year old male presents for 4-day history of illness.  Has had fever, fatigue, cough, congestion and bodyaches.  Reports feeling a little bit better today.  Does not believe he had a fever today.  Has been taking Tessalon Perles and Tylenol.  Vitals are stable.  Blood pressure a little elevated at 147/81.  Overall well-appearing.  No acute distress.  On exam is nasal congestion.  Throat clear.  Chest clear auscultation.  Heart regular rate and rhythm.  Advised patient symptoms may be consistent with flu or COVID.  Explained obtaining a respiratory panel can be costly and does not necessarily change management.  Still, I offered the test to him.  He elects to hold off at this  time.  Also offered chest x-ray but explained my suspicion for pneumonia is pretty low. Explained he likely has influenza.  Encouraged him to increase rest and fluids.  I sent Promethazine DM for nighttime use.  Continue benzonatate and antibiotics as needed.  Reviewed current  CDC guidelines, isolation protocol and ED precautions for flu and COVID.  Advised to return if not breaking fever in a few days or acute worsening of symptoms.  Final Clinical Impressions(s) / UC Diagnoses   Final diagnoses:  Influenza-like illness  Acute cough  Nasal congestion     Discharge Instructions      -Possibly influenza or other viral illness. -Isolate until fever free 24 hours and feeling better -I sent cough medicine for bed time as needed -Increase rest and fluids -Return if not breaking fever in 2-3 more days or you have chest pain or breathing difficulty     ED Prescriptions     Medication Sig Dispense Auth. Provider   promethazine-dextromethorphan (PROMETHAZINE-DM) 6.25-15 MG/5ML syrup Take 5 mLs by mouth at bedtime as needed for cough. 118 mL Shirlee Latch, PA-C      PDMP not reviewed this encounter.   Shirlee Latch, PA-C 08/29/23 1515

## 2023-08-29 NOTE — ED Triage Notes (Signed)
 Pt c/o cough,fever & congestion x4 days. Tmax 101.5 last night. Had teledoc visit on Tuesday, dx w/bronchitis & was given tessalon pearls w/o relief.

## 2023-08-29 NOTE — Discharge Instructions (Addendum)
-  Possibly influenza or other viral illness. -Isolate until fever free 24 hours and feeling better -I sent cough medicine for bed time as needed -Increase rest and fluids -Return if not breaking fever in 2-3 more days or you have chest pain or breathing difficulty

## 2024-07-01 ENCOUNTER — Emergency Department

## 2024-07-01 ENCOUNTER — Emergency Department
Admission: EM | Admit: 2024-07-01 | Discharge: 2024-07-01 | Disposition: A | Discharge status: Home / Self Care | Attending: Emergency Medicine | Admitting: Emergency Medicine

## 2024-07-01 ENCOUNTER — Other Ambulatory Visit: Payer: Self-pay

## 2024-07-01 ENCOUNTER — Encounter: Payer: Self-pay | Admitting: Emergency Medicine

## 2024-07-01 DIAGNOSIS — I10 Essential (primary) hypertension: Secondary | ICD-10-CM | POA: Insufficient documentation

## 2024-07-01 DIAGNOSIS — Z79899 Other long term (current) drug therapy: Secondary | ICD-10-CM | POA: Insufficient documentation

## 2024-07-01 DIAGNOSIS — R002 Palpitations: Secondary | ICD-10-CM | POA: Diagnosis present

## 2024-07-01 LAB — COMPREHENSIVE METABOLIC PANEL WITH GFR
ALT: 33 U/L (ref 0–44)
AST: 46 U/L — ABNORMAL HIGH (ref 15–41)
Albumin: 4.3 g/dL (ref 3.5–5.0)
Alkaline Phosphatase: 58 U/L (ref 38–126)
Anion gap: 13 (ref 5–15)
BUN: 16 mg/dL (ref 6–20)
CO2: 26 mmol/L (ref 22–32)
Calcium: 9.1 mg/dL (ref 8.9–10.3)
Chloride: 102 mmol/L (ref 98–111)
Creatinine, Ser: 1.31 mg/dL — ABNORMAL HIGH (ref 0.61–1.24)
GFR, Estimated: >60 mL/min
Glucose, Bld: 140 mg/dL — ABNORMAL HIGH (ref 70–99)
Potassium: 3.5 mmol/L (ref 3.5–5.1)
Sodium: 141 mmol/L (ref 135–145)
Total Bilirubin: 0.6 mg/dL (ref 0.0–1.2)
Total Protein: 7.3 g/dL (ref 6.5–8.1)

## 2024-07-01 LAB — CBC
HCT: 48.1 % (ref 39.0–52.0)
Hemoglobin: 16.1 g/dL (ref 13.0–17.0)
MCH: 28.4 pg (ref 26.0–34.0)
MCHC: 33.5 g/dL (ref 30.0–36.0)
MCV: 85 fL (ref 80.0–100.0)
Platelets: 208 K/uL (ref 150–400)
RBC: 5.66 MIL/uL (ref 4.22–5.81)
RDW: 12.7 % (ref 11.5–15.5)
WBC: 8.1 K/uL (ref 4.0–10.5)
nRBC: 0 % (ref 0.0–0.2)

## 2024-07-01 LAB — TSH: TSH: 3.64 u[IU]/mL (ref 0.350–4.500)

## 2024-07-01 LAB — MAGNESIUM: Magnesium: 2 mg/dL (ref 1.7–2.4)

## 2024-07-01 LAB — TROPONIN T, HIGH SENSITIVITY: Troponin T High Sensitivity: <15 ng/L (ref 0–19)

## 2024-07-01 NOTE — ED Provider Notes (Signed)
 "  [facility] Provider Note    Event Date/Time   First MD Initiated Contact with Patient 07/01/24 [phone]     (approximate)   History   Hypertension   HPI  Francisco Forbes is a 54 y.o. male with history of hypertension, prediabetes who presents to the emergency department with palpitations that woke him from sleep that lasted a few seconds that he felt like was a bubbling or a flutter.  He states that he started to feel like his blood pressure was rising and he checked and it was in the 180s/100s which is abnormal for him.  Decided then to come to the emergency department.  Blood pressure now in the 140s/90s.  He is feeling better.  No chest tightness, shortness of breath, vomiting or diarrhea, increased caffeine intake, stimulant use, illicit drug use.  No calf tenderness or calf swelling.  No recent infectious symptoms.   History provided by patient and wife.    Past Medical History:  Diagnosis Date   Hypertension    Prediabetes 03/26/2022    Past Surgical History:  Procedure Laterality Date   BACK SURGERY     NASAL TURBINATE REDUCTION N/A 05/11/2022   Procedure: TURBINATE REDUCTION/SUBMUCOSAL RESECTION;  Surgeon: Herminio Miu, MD;  Location: [facility];  Service: ENT;  Laterality: N/A;   SEPTOPLASTY N/A 05/11/2022   Procedure: SEPTOPLASTY;  Surgeon: Herminio Miu, MD;  Location: [facility];  Service: ENT;  Laterality: N/A;    MEDICATIONS:  Prior to Admission medications  Medication Sig Start Date End Date Taking? Authorizing Provider  atenolol (TENORMIN) 25 MG tablet Take 1 tablet by mouth daily. 07/06/21   [provider]  promethazine -dextromethorphan (PROMETHAZINE -DM) 6.25-15 MG/5ML syrup Take 5 mLs by mouth at bedtime as needed for cough. 08/29/23   Arvis Jolan NOVAK, PA-C    Physical Exam   Triage Vital Signs: ED Triage Vitals  Encounter Vitals Group     BP 07/01/24 0338 (!) 171/79     Girls Systolic BP  Percentile --      Girls Diastolic BP Percentile --      Boys Systolic BP Percentile --      Boys Diastolic BP Percentile --      Pulse Rate 07/01/24 0338 95     Resp 07/01/24 0338 (!) 22     Temp 07/01/24 0338 (!) 97.5 F (36.4 C)     Temp Source 07/01/24 0338 Oral     SpO2 07/01/24 0338 97 %     Weight 07/01/24 0336 204 lb (92.5 kg)     Height 07/01/24 0336 5' 8 (1.727 m)     Head Circumference --      Peak Flow --      Pain Score 07/01/24 0335 0     Pain Loc --      Pain Education --      Exclude from Growth Chart --     Most recent vital signs: Vitals:   07/01/24 0400 07/01/24 0430  BP: (!) 146/90 (!) 142/96  Pulse: 71 68  Resp: 15 13  Temp:    SpO2: 96% 97%    CONSTITUTIONAL: Alert, responds appropriately to questions. Well-appearing; well-nourished HEAD: Normocephalic, atraumatic EYES: Conjunctivae clear, pupils appear equal, sclera nonicteric ENT: normal nose; moist mucous membranes NECK: Supple, normal ROM CARD: RRR; S1 and S2 appreciated RESP: Normal chest excursion without splinting or tachypnea; breath sounds clear and equal bilaterally; no wheezes, no rhonchi, no rales, no hypoxia or respiratory  distress, speaking full sentences ABD/GI: Non-distended; soft, non-tender, no rebound, no guarding, no peritoneal signs BACK: The back appears normal EXT: Normal ROM in all joints; no deformity noted, no edema, no calf tenderness or calf swelling SKIN: Normal color for age and race; warm; no rash on exposed skin NEURO: Moves all extremities equally, normal speech PSYCH: The patient's mood and manner are appropriate.   ED Results / Procedures / Treatments   LABS: (all labs ordered are listed, but only abnormal results are displayed) Labs Reviewed  COMPREHENSIVE METABOLIC PANEL WITH GFR - Abnormal; Notable for the following components:      Result Value   Glucose, Bld 140 (*)    Creatinine, Ser 1.31 (*)    AST 46 (*)    All other components within normal  limits  CBC  MAGNESIUM  TSH  TROPONIN T, HIGH SENSITIVITY     EKG:  EKG Interpretation Date/Time:  Wednesday July 01 2024 03:40:21 EST Ventricular Rate:  82 PR Interval:  146 QRS Duration:  110 QT Interval:  416 QTC Calculation: 486 R Axis:   0  Text Interpretation: Normal sinus rhythm with sinus arrhythmia Incomplete right bundle branch block Nonspecific ST abnormality Prolonged QT Abnormal ECG When compared with ECG of 02-Nov-2021 09:54, No significant change was found Confirmed by Neomi Neptune [phone]) on 07/01/2024 4:06:00 AM         RADIOLOGY: My personal review and interpretation of imaging: Chest x-ray clear.  I have personally reviewed all radiology reports.   DG Chest Portable 1 View Result Date: 07/01/2024 EXAM: 1 VIEW(S) XRAY OF THE CHEST 07/01/2024 04:20:01 AM COMPARISON: None available. CLINICAL HISTORY: Palpitations, evaluate cardiac silhouette Palpitations, evaluate cardiac silhouette FINDINGS: LUNGS AND PLEURA: No focal pulmonary opacity. No pleural effusion. No pneumothorax. HEART AND MEDIASTINUM: No acute abnormality of the cardiac and mediastinal silhouettes. BONES AND SOFT TISSUES: No acute osseous abnormality. IMPRESSION: 1. No acute process. Electronically signed by: Francis Quam MD 07/01/2024 04:35 AM EST RP Workstation: [id number]     PROCEDURES:    .1-3 Lead EKG Interpretation  Performed by: Myrella Fahs, Neptune SAILOR, DO Authorized by: Lucifer Soja, Neptune SAILOR, DO     Interpretation: normal     ECG rate:  95   ECG rate assessment: normal     Rhythm: sinus rhythm     Ectopy: none     Conduction: normal       IMPRESSION / MDM / ASSESSMENT AND PLAN / ED COURSE  I reviewed the triage vital signs and the nursing notes.    Patient here with palpitations and high blood pressure.  Currently asymptomatic and blood pressure now in the 140s/90s.  The patient is on the cardiac monitor to evaluate for evidence of arrhythmia and/or significant heart rate  changes.   DIFFERENTIAL DIAGNOSIS (includes but not limited to):   PVCs, arrhythmia, anemia, electrolyte derangement, thyroid dysfunction, low suspicion for ACS, PE, dissection, CHF, pneumothorax, hypertensive urgency or emergency   Patient's presentation is most consistent with acute presentation with potential threat to life or bodily function.   PLAN: Will obtain labs, chest x-ray.  EKG shows no new ischemic change, interval abnormality or arrhythmia.  Currently asymptomatic and blood pressure is 146/90.  Blood pressure is trending down on its own.  Will continue to monitor.   MEDICATIONS GIVEN IN ED: Medications - No data to display   ED COURSE: Patient continues to be asymptomatic.  Normal hemoglobin, electrolytes, TSH, negative troponin.  Chest x-ray reviewed and interpreted by myself and  the radiologist and is clear.  No events noted on cardiac monitoring.  Have recommended close follow-up with his primary care doctor but no indication for further emergent workup.  He is comfortable with this plan.   At this time, I do not feel there is any life-threatening condition present. I reviewed all nursing notes, vitals, pertinent previous records.  All lab and urine results, EKGs, imaging ordered have been independently reviewed and interpreted by myself.  I reviewed all available radiology reports from any imaging ordered this visit.  Based on my assessment, I feel the patient is safe to be discharged home without further emergent workup and can continue workup as an outpatient as needed. Discussed all findings, treatment plan as well as usual and customary return precautions.  They verbalize understanding and are comfortable with this plan.  Outpatient follow-up has been provided as needed.  All questions have been answered.    CONSULTS:  none   OUTSIDE RECORDS REVIEWED: Reviewed most recent family medicine notes.       FINAL CLINICAL IMPRESSION(S) / ED DIAGNOSES   Final  diagnoses:  Palpitations  Hypertension, unspecified type     Rx / DC Orders   ED Discharge Orders     None        Note:  This document was prepared using Dragon voice recognition software and may include unintentional dictation errors.   Josilynn Losh, Josette SAILOR, DO 07/01/24 [phone]  "

## 2024-07-01 NOTE — ED Triage Notes (Signed)
 Patient ambulatory to triage with steady gait, without difficulty or distress noted; pt reports awoke feeling like his heart was out of rhythm; checked his BP and it was elevated

## 2024-07-01 NOTE — ED Notes (Signed)
Patient verbalizes understanding of discharge instructions. Opportunity for questioning and answers were provided. Armband removed by staff, pt discharged from ED. Ambulated out to lobby with wife
